# Patient Record
Sex: Female | Born: 1965 | Race: Black or African American | Hispanic: No | Marital: Married | Smoking: Never smoker
Health system: Southern US, Community
[De-identification: ages and names within clinical notes are randomized; demographics above are authoritative.]

## PROBLEM LIST (undated history)

## (undated) DIAGNOSIS — E785 Hyperlipidemia, unspecified: Secondary | ICD-10-CM

## (undated) DIAGNOSIS — E079 Disorder of thyroid, unspecified: Secondary | ICD-10-CM

## (undated) DIAGNOSIS — D649 Anemia, unspecified: Secondary | ICD-10-CM

## (undated) HISTORY — PX: TUBAL LIGATION: SHX77

## (undated) HISTORY — DX: Hyperlipidemia, unspecified: E78.5

## (undated) HISTORY — DX: Anemia, unspecified: D64.9

## (undated) HISTORY — DX: Disorder of thyroid, unspecified: E07.9

---

## 2005-08-17 ENCOUNTER — Ambulatory Visit: Payer: Self-pay | Admitting: Family Medicine

## 2005-11-01 ENCOUNTER — Other Ambulatory Visit: Admission: RE | Admit: 2005-11-01 | Discharge: 2005-11-01 | Payer: Self-pay | Admitting: Family Medicine

## 2005-11-01 ENCOUNTER — Ambulatory Visit: Payer: Self-pay | Admitting: Family Medicine

## 2005-11-01 LAB — CONVERTED CEMR LAB: Pap Smear: NORMAL

## 2005-11-02 ENCOUNTER — Encounter: Payer: Self-pay | Admitting: Family Medicine

## 2007-05-30 ENCOUNTER — Encounter: Payer: Self-pay | Admitting: Family Medicine

## 2007-05-30 DIAGNOSIS — E039 Hypothyroidism, unspecified: Secondary | ICD-10-CM | POA: Insufficient documentation

## 2007-05-30 DIAGNOSIS — E049 Nontoxic goiter, unspecified: Secondary | ICD-10-CM | POA: Insufficient documentation

## 2007-05-30 DIAGNOSIS — E785 Hyperlipidemia, unspecified: Secondary | ICD-10-CM | POA: Insufficient documentation

## 2007-05-30 DIAGNOSIS — E063 Autoimmune thyroiditis: Secondary | ICD-10-CM | POA: Insufficient documentation

## 2007-05-31 ENCOUNTER — Ambulatory Visit: Payer: Self-pay | Admitting: Family Medicine

## 2007-05-31 LAB — CONVERTED CEMR LAB: Beta hcg, urine, semiquantitative: POSITIVE

## 2007-06-01 ENCOUNTER — Encounter: Payer: Self-pay | Admitting: Family Medicine

## 2007-06-02 ENCOUNTER — Ambulatory Visit: Payer: Self-pay | Admitting: Obstetrics & Gynecology

## 2007-06-07 ENCOUNTER — Ambulatory Visit (HOSPITAL_COMMUNITY): Admission: RE | Admit: 2007-06-07 | Discharge: 2007-06-07 | Payer: Self-pay | Admitting: Gynecology

## 2007-06-15 ENCOUNTER — Encounter: Payer: Self-pay | Admitting: Obstetrics & Gynecology

## 2007-06-15 ENCOUNTER — Ambulatory Visit: Payer: Self-pay | Admitting: Obstetrics & Gynecology

## 2007-06-16 ENCOUNTER — Ambulatory Visit: Payer: Self-pay | Admitting: Obstetrics & Gynecology

## 2007-07-03 ENCOUNTER — Encounter: Payer: Self-pay | Admitting: Family Medicine

## 2007-07-13 ENCOUNTER — Ambulatory Visit: Payer: Self-pay | Admitting: Obstetrics & Gynecology

## 2007-07-21 ENCOUNTER — Ambulatory Visit (HOSPITAL_COMMUNITY): Admission: RE | Admit: 2007-07-21 | Discharge: 2007-07-21 | Payer: Self-pay | Admitting: Obstetrics & Gynecology

## 2007-08-01 ENCOUNTER — Ambulatory Visit (HOSPITAL_COMMUNITY): Admission: RE | Admit: 2007-08-01 | Discharge: 2007-08-01 | Payer: Self-pay | Admitting: Obstetrics & Gynecology

## 2007-08-14 ENCOUNTER — Ambulatory Visit: Payer: Self-pay | Admitting: Family Medicine

## 2007-08-23 ENCOUNTER — Ambulatory Visit (HOSPITAL_COMMUNITY): Admission: RE | Admit: 2007-08-23 | Discharge: 2007-08-23 | Payer: Self-pay | Admitting: Obstetrics & Gynecology

## 2007-09-13 ENCOUNTER — Ambulatory Visit: Payer: Self-pay | Admitting: Obstetrics & Gynecology

## 2007-10-04 ENCOUNTER — Ambulatory Visit: Payer: Self-pay | Admitting: Obstetrics & Gynecology

## 2007-10-25 ENCOUNTER — Ambulatory Visit: Payer: Self-pay | Admitting: Obstetrics & Gynecology

## 2007-11-09 ENCOUNTER — Ambulatory Visit: Payer: Self-pay | Admitting: Obstetrics & Gynecology

## 2007-11-27 ENCOUNTER — Encounter: Payer: Self-pay | Admitting: Family Medicine

## 2007-12-06 ENCOUNTER — Ambulatory Visit: Payer: Self-pay | Admitting: Family Medicine

## 2007-12-08 ENCOUNTER — Ambulatory Visit (HOSPITAL_COMMUNITY): Admission: RE | Admit: 2007-12-08 | Discharge: 2007-12-08 | Payer: Self-pay | Admitting: Gynecology

## 2007-12-10 ENCOUNTER — Inpatient Hospital Stay (HOSPITAL_COMMUNITY): Admission: AD | Admit: 2007-12-10 | Discharge: 2007-12-12 | Payer: Self-pay | Admitting: Obstetrics and Gynecology

## 2007-12-10 ENCOUNTER — Ambulatory Visit: Payer: Self-pay | Admitting: Family Medicine

## 2008-01-23 ENCOUNTER — Ambulatory Visit: Payer: Self-pay | Admitting: Family Medicine

## 2008-01-25 ENCOUNTER — Ambulatory Visit: Payer: Self-pay | Admitting: Vascular Surgery

## 2008-01-25 ENCOUNTER — Encounter: Payer: Self-pay | Admitting: Family Medicine

## 2008-01-25 ENCOUNTER — Ambulatory Visit (HOSPITAL_COMMUNITY): Admission: RE | Admit: 2008-01-25 | Discharge: 2008-01-25 | Payer: Self-pay | Admitting: Family Medicine

## 2008-02-27 ENCOUNTER — Encounter: Payer: Self-pay | Admitting: Family Medicine

## 2008-02-27 ENCOUNTER — Ambulatory Visit: Payer: Self-pay | Admitting: Family Medicine

## 2008-03-01 ENCOUNTER — Encounter: Payer: Self-pay | Admitting: Family Medicine

## 2008-10-01 ENCOUNTER — Ambulatory Visit: Payer: Self-pay | Admitting: Family Medicine

## 2008-10-01 DIAGNOSIS — H05019 Cellulitis of unspecified orbit: Secondary | ICD-10-CM | POA: Insufficient documentation

## 2009-02-20 ENCOUNTER — Ambulatory Visit: Payer: Self-pay | Admitting: Obstetrics & Gynecology

## 2009-02-20 ENCOUNTER — Encounter: Payer: Self-pay | Admitting: Obstetrics & Gynecology

## 2009-02-20 ENCOUNTER — Encounter: Payer: Self-pay | Admitting: Family Medicine

## 2009-02-20 LAB — CONVERTED CEMR LAB
Cholesterol: 217 mg/dL — ABNORMAL HIGH (ref 0–200)
Glucose, Bld: 85 mg/dL (ref 70–99)
HDL: 46 mg/dL (ref >39)
LDL Cholesterol: 149 mg/dL — ABNORMAL HIGH (ref 0–99)
Total CHOL/HDL Ratio: 4.7

## 2010-03-20 ENCOUNTER — Encounter: Payer: Self-pay | Admitting: Family Medicine

## 2010-11-17 NOTE — Letter (Signed)
Summary: [facility]   Imported By: Lanelle Bal 03/26/2010 10:40:20  _____________________________________________________________________  External Attachment:    Type:   Image     Comment:   External Document

## 2011-03-02 NOTE — Op Note (Signed)
NAME:  Carrie May, Carrie May             ACCOUNT NO.:  [id number]   MEDICAL RECORD NO.:  [id number]          PATIENT TYPE:  INP   LOCATION:  9146                          FACILITY:  [facility]   PHYSICIAN:  Tanya S. Shawnie Pons, M.D.   DATE OF BIRTH:  [birth year]   DATE OF PROCEDURE:  12/11/2007  DATE OF DISCHARGE:                               OPERATIVE REPORT   PREOPERATIVE DIAGNOSIS:  1. Postpartum day #1 from spontaneous vaginal delivery.  2. Desires sterilization.   POSTOPERATIVE DIAGNOSIS:  1. Postpartum day #1 from spontaneous vaginal delivery.  2. Desires sterilization.   PROCEDURE:  Bilateral tubal occlusion with Filshie clips.   SURGEON:  Shelbie Proctor. Shawnie Pons, M.D.   ASSISTANT:  Karlton Lemon, M.D.   ANESTHESIA:  General.   FINDINGS:  Normal female anatomy with normal appearing tubes.   ESTIMATED BLOOD LOSS:  Minimal.   DRAINS:  Foley with clear yellow urine.   COMPLICATIONS:  None immediate.   SPECIMENS:  None.   INDICATIONS FOR PROCEDURE:  The patient is a 45 year old gravida 3 para  3-0-0-3 patient that delivered on December 10, 2007, via spontaneous  vaginal delivery.  It is her desire at this time to undergo permanent  sterilization with tubal ligation.  The patient was informed of the  permanent nature of this procedure and wishes to proceed.  Risks,  benefits, and alternatives were discussed with the patient prior to the  procedure.   DESCRIPTION OF PROCEDURE:  The patient was taken to the operating room  and after obtaining adequate general anesthesia was prepped and draped  in the usual sterile fashion in supine position.  After ensuring  adequate anesthesia a small transverse skin incision was made below the  umbilicus.  The incision was carried down through the subcutaneous  tissues using the scalpel and Mayo scissors.  The fascia was identified  and separated and incised with a scalpel and Mayo scissors.  Army-Navy  retractors were then placed within the peritoneal  cavity.  The patient  placed in Trendelenburg with a rightward tilt.  The left fallopian tube  was identified and clamped with a Babcock clamp.  A second Babcock clamp  was used to follow the tube out and the fimbria was well visualized.  A  Filshie clip was then placed on the tube between the two Babcock clamps  under direct visualization.  After placement of the clip, the Babcock  clamps were released and the tube was allowed to fall back within the  peritoneal cavity.  Attention was then turned to the right tube.  The  patient was placed in a continued Trendelenburg position with a leftward  tilt.  The right tube was identified and clamped with a Babcock clamp.  A second Babcock clamp was placed and the tube was followed out to the  fimbria which was easily identified.  A Filshie clip was then placed  between the two Babcock clamps under good visualization.  The clamps  were then released and the tube was allowed to fall back within the  peritoneal cavity.  The fascia was then closed with 0 Vicryl in a  running locked fashion.  The skin was then closed with a subcuticular  stitch of 4-0 Vicryl.  0.25% Marcaine was injected in the subcutaneous  tissues after skin closure.  Estimated blood loss was minimal.  Needle,  sponge, and instrument counts correct x2.  The patient tolerated the  procedure well and went to the postanesthesia care unit in stable  condition.      Karlton Lemon, MD  Electronically Signed     ______________________________  Shelbie Proctor. Shawnie Pons, M.D.    NS/MEDQ  D:  12/11/2007  T:  12/12/2007  Job:  [id number]

## 2011-03-02 NOTE — Assessment & Plan Note (Signed)
NAME:  Carrie May, Carrie May             ACCOUNT NO.:  [id number]   MEDICAL RECORD NO.:  [id number]          PATIENT TYPE:  POB   LOCATION:  [facility] at [facility]         FACILITY:  [facility]   PHYSICIAN:  Tinnie Gens, MD        DATE OF BIRTH:  1966-07-27   DATE OF SERVICE:  01/23/2008                                  CLINIC NOTE   She is here for a postpartum check.   HISTORY OF PRESENT ILLNESS:  The patient is a 45 year old gravida 3,  para 3 who is 6 weeks postpartum from a vaginal delivery.  She is status  post tubal ligation in the hospital. She complains of calf pain that  started approximately 2 weeks postpartum. Traveled up the back side of  her leg and has been fairly persistent since then. She has a positive.  She denies any significant swelling of that leg.  She denies shortness  of breath.  She is nursing followed by a bottle feeding. The child is  not sleeping well.  Her mood is fine and she denies significant vaginal  bleeding.   PHYSICAL EXAMINATION:  Vitals are as noted in the chart.  She is a well-  developed, well-nourished female in no acute distress.  BTL incision is  well-healed. Calves are roughly the same size on measurement at 40 cm.  She does have a positive Homan's and a possible cord in the right lower  extremity.   IMPRESSION:  Pain in the calf with positive Homan's.   PLAN:  Followup for Pap smear in August of this year and lower extremity  Doppler to rule out deep vein thrombosis.           ______________________________  Tinnie Gens, MD     TP/MEDQ  D:  01/23/2008  T:  01/23/2008  Job:  [id number]

## 2011-03-02 NOTE — Assessment & Plan Note (Signed)
NAME:  EMERY, BINZ             ACCOUNT NO.:  [id number]   MEDICAL RECORD NO.:  [id number]          PATIENT TYPE:  POB   LOCATION:  [facility] at [facility]         FACILITY:  [facility]   PHYSICIAN:  Allie Bossier, MD        DATE OF BIRTH:  March 30, 1966   DATE OF SERVICE:  02/20/2009                                  CLINIC NOTE   Jashanti is a 45 year old married lady with 3 children ages two  daughters, they are 84 and 57 years old and her son who is 68.  She comes  in today for annual exam.  She has no particular GYN complaints.  She  does say that she finds it difficult to lose weight especially since her  thyroid dysfunction began.  She reports her periods are monthly and last  about 5 days.   PAST MEDICAL HISTORY:  Cholesterol was elevated last year.  She is  overweight.  She has hypothyroidism.   PAST SURGICAL HISTORY:  Tubal ligation.   FAMILY HISTORY:  Negative for breast, GYN, and colon malignancies.   SOCIAL HISTORY:  She reports rare alcohol use and denies tobacco or drug  use.   ALLERGIES:  No known drug allergies.  No to latex allergies.   MEDICATIONS:  She takes Flintstones twice a day, Armour thyroxine 90 mcg  a day.   PHYSICAL EXAMINATION:  VITAL SIGNS:  Height 5 feet 3 inches, weight 188,  blood pressure 112/91, pulse 83.  HEENT:  Normal breast exam normal bilaterally.  HEART:  Regular rate and rhythm.  LUNGS:  Clear to auscultation bilaterally.  ABDOMEN:  Benign.  No hepatosplenomegaly.  EXTERNAL GENITALIA:  No lesions.  Cervix parous, normal discharge.  Pap  smear was obtained.  Bimanual exam reveals a 8-week size uterus,  slightly deviated to the left.  There are no adnexal masses.   ASSESSMENT AND PLAN:  1. Annual exam checked a Pap smear.  Recommend self-breast and self-      vulvar exams.  2. Uterine enlargement, scheduled an ultrasound.  3. Overweight and history of high cholesterol.  I am rechecking      fasting lipids and sugar today, and I have suggested  a weight loss      plan.  I am rechecking her TSH today.  Should the TSH be in the      high range of normal      then I would suggest she increase her Armour thyroxine to increase      her metabolism and assist her in weight loss.       Allie Bossier, MD     MCD/MEDQ  D:  02/20/2009  T:  02/20/2009  Job:  [id number]

## 2011-03-02 NOTE — Assessment & Plan Note (Signed)
NAME:  Carrie May, Carrie May             ACCOUNT NO.:  [id number]   MEDICAL RECORD NO.:  [id number]          PATIENT TYPE:  POB   LOCATION:  [facility] at [facility]         FACILITY:  [facility]   PHYSICIAN:  Tinnie Gens, MD        DATE OF BIRTH:  07/25/1966   DATE OF SERVICE:  02/27/2008                                  CLINIC NOTE   CHIEF COMPLAINT:  Physical exam.   HISTORY OF PRESENT ILLNESS:  The patient is a 41-year gravida 3, para 3,  who is several months postpartum.  She is status post BTL.  Her last Pap  was in May 2008.  She is here today for her yearly exam.  She continues  to nurse.  Her baby is feeding well.  Her mood is well.   PAST MEDICAL HISTORY:  Significant for hypothyroidism.   PAST SURGICAL HISTORY:  BTL.   MEDICATIONS:  She is on the Armour Thyroid 90 mcg daily, on prenatal  vitamins daily.   ALLERGIES:  None known.   OBSTETRICAL HISTORY:  She has had 3 previous vaginal deliveries.   GYNECOLOGICAL HISTORY:  No history of abnormal Pap smears.   FAMILY HISTORY:  Diabetes and hypothyroidism.   SOCIAL HISTORY:  No tobacco, alcohol or drug use.   REVIEW OF SYSTEMS:  Fourteen-point review of systems reviewed and is  negative except as in the HPI.   PHYSICAL EXAMINATION:  GENERAL:  She is a well-developed, well-nourished  female in no acute distress.  NECK:  Supple.  Normal thyroid.  LUNGS:  Clear bilaterally.  CARDIOVASCULAR:  Regular rate and rhythm.  No rubs, gallops, murmurs.  ABDOMEN:  Soft, nontender, nondistended.  BREASTS:  Symmetric with everted nipples.  No masses.  No  supraclavicular or axillary adenopathy.  EXTREMITIES:  No cyanosis, clubbing or edema.  GU:  Normal external female genitalia.  BUS normal.  Vagina is pink and  rugated.  Cervix is parous without lesion.  The uterus is small,  anteverted.  No adnexal mass or tenderness.   IMPRESSION:  1. Gynecology examination with Pap smear.  2. Hypothyroidism.   PLAN:  1. Check fasting lipids, CMP,  CBC and TSH.  2. Pap smear today.  3. Follow up in a year.  4. Schedule mammogram when she stops breast-feeding.           ______________________________  Tinnie Gens, MD     TP/MEDQ  D:  02/27/2008  T:  02/27/2008  Job:  [id number]

## 2011-07-09 LAB — CBC
HCT: 21.4 — ABNORMAL LOW
Hemoglobin: 10 — ABNORMAL LOW
MCHC: 33.9
MCHC: 34.3
MCV: 82.6
Platelets: 187
Platelets: 216
Platelets: 231
RBC: 2.59 — ABNORMAL LOW
RBC: 3.58 — ABNORMAL LOW
RBC: 3.74 — ABNORMAL LOW
RDW: 13.6
RDW: 14
WBC: 10.3
WBC: 9.3

## 2011-07-09 LAB — CCBB MATERNAL DONOR DRAW

## 2011-11-09 ENCOUNTER — Ambulatory Visit (INDEPENDENT_AMBULATORY_CARE_PROVIDER_SITE_OTHER): Payer: BLUE CROSS/BLUE SHIELD | Admitting: Obstetrics and Gynecology

## 2011-11-09 ENCOUNTER — Encounter: Payer: Self-pay | Admitting: Obstetrics and Gynecology

## 2011-11-09 VITALS — BP 139/83 | HR 62 | Ht 63.0 in | Wt 166.0 lb

## 2011-11-09 DIAGNOSIS — N390 Urinary tract infection, site not specified: Secondary | ICD-10-CM

## 2011-11-09 DIAGNOSIS — N760 Acute vaginitis: Secondary | ICD-10-CM

## 2011-11-09 LAB — POCT URINALYSIS DIPSTICK
Ketones, UA: NEGATIVE
Leukocytes, UA: NEGATIVE

## 2011-11-09 NOTE — Progress Notes (Signed)
46 yo G3P3 presenting today for evaluation of burning vaginal disharge.  Patient is in a monogamous relationship and is not concerned about STD. Patient denies any odor with discharge. Patient has not been seen in this office since 2010 and reports receiving her annual exams and mammogram from previous MD office.  SSE: Small thin white discharde, no odor. Normal appearing vagina and cervix  A/P 46 yo G3p3 with vaginitis -wet prep collected - patient will be contacted with any abnormal results - Patient to return to our office for annual exam if not already performed in the last 12 months if she desires.

## 2011-11-10 ENCOUNTER — Telehealth: Payer: Self-pay | Admitting: *Deleted

## 2011-11-10 DIAGNOSIS — N760 Acute vaginitis: Secondary | ICD-10-CM

## 2011-11-10 LAB — WET PREP, GENITAL: Yeast Wet Prep HPF POC: NONE SEEN

## 2011-11-10 MED ORDER — METRONIDAZOLE 500 MG PO TABS: 500.0000 mg | ORAL_TABLET | Freq: Two times a day (BID) | ORAL | Status: AC

## 2011-11-10 NOTE — Telephone Encounter (Signed)
Culture came back positive for clue cells.  Meds called in.

## 2011-11-30 ENCOUNTER — Ambulatory Visit (INDEPENDENT_AMBULATORY_CARE_PROVIDER_SITE_OTHER): Payer: BLUE CROSS/BLUE SHIELD | Admitting: Family Medicine

## 2011-11-30 ENCOUNTER — Encounter: Payer: Self-pay | Admitting: Family Medicine

## 2011-11-30 DIAGNOSIS — Z1272 Encounter for screening for malignant neoplasm of vagina: Secondary | ICD-10-CM

## 2011-11-30 DIAGNOSIS — N760 Acute vaginitis: Secondary | ICD-10-CM

## 2011-11-30 DIAGNOSIS — Z01419 Encounter for gynecological examination (general) (routine) without abnormal findings: Secondary | ICD-10-CM

## 2011-11-30 DIAGNOSIS — A499 Bacterial infection, unspecified: Secondary | ICD-10-CM

## 2011-11-30 LAB — TSH: TSH: 0.525 u[IU]/mL (ref 0.350–4.500)

## 2011-11-30 LAB — COMPREHENSIVE METABOLIC PANEL
Albumin: 4.8 g/dL (ref 3.5–5.2)
Alkaline Phosphatase: 64 U/L (ref 39–117)
Calcium: 9.6 mg/dL (ref 8.4–10.5)
Chloride: 103 mEq/L (ref 96–112)
Creat: 0.78 mg/dL (ref 0.50–1.10)
Glucose, Bld: 80 mg/dL (ref 70–99)
Potassium: 4.2 mEq/L (ref 3.5–5.3)
Sodium: 139 mEq/L (ref 135–145)

## 2011-11-30 LAB — LIPID PANEL
Cholesterol: 211 mg/dL — ABNORMAL HIGH (ref 0–200)
HDL: 50 mg/dL (ref >39)
LDL Cholesterol: 143 mg/dL — ABNORMAL HIGH (ref 0–99)
Total CHOL/HDL Ratio: 4.2 Ratio

## 2011-11-30 MED ORDER — METRONIDAZOLE 0.75 % VA GEL: 1.0000 | Freq: Every day | VAGINAL | Status: AC

## 2011-11-30 NOTE — Progress Notes (Signed)
  Subjective:     Carrie May is a 46 y.o. female and is here for a comprehensive physical exam. The patient reports no problems.  History   Social History  . Marital Status: Married    Spouse Name: N/A    Number of Children: N/A  . Years of Education: N/A   Occupational History  . Not on file.   Social History Main Topics  . Smoking status: Never Smoker   . Smokeless tobacco: Not on file  . Alcohol Use: Yes     occasion  . Drug Use: No  . Sexually Active: Yes -- Female partner(s)    Birth Control/ Protection: Condom, Surgical     tubalization.   Other Topics Concern  . Not on file   Social History Narrative  . No narrative on file   Health Maintenance  Topic Date Due  . Pap Smear  09/04/1984  . Tetanus/tdap  09/04/1985  . Influenza Vaccine  07/19/2011    The following portions of the patient's history were reviewed and updated as appropriate: allergies, current medications, past family history, past medical history, past social history, past surgical history and problem list.  Review of Systems A comprehensive review of systems was negative.   Objective:    BP 132/65  Pulse 63  Ht 5\' 3"  (1.6 m)  Wt 167 lb (75.751 kg)  BMI 29.58 kg/m2  LMP 11/22/2011 General appearance: alert, cooperative and appears stated age Head: Normocephalic, without obvious abnormality, atraumatic Neck: no adenopathy, supple, symmetrical, trachea midline and thyroid not enlarged, symmetric, no tenderness/mass/nodules Lungs: clear to auscultation bilaterally Breasts: normal appearance, no masses or tenderness Heart: regular rate and rhythm, S1, S2 normal, no murmur, click, rub or gallop Abdomen: soft, non-tender; bowel sounds normal; no masses,  no organomegaly Pelvic: cervix normal in appearance, external genitalia normal, no adnexal masses or tenderness, no cervical motion tenderness, uterus normal size, shape, and consistency and vagina normal without discharge Extremities:  extremities normal, atraumatic, no cyanosis or edema Pulses: 2+ and symmetric Skin: Skin color, texture, turgor normal. No rashes or lesions Lymph nodes: Cervical, supraclavicular, and axillary nodes normal. Neurologic: Grossly normal    Assessment:    Healthy female exam. BV     Plan:    Pap smear Mammo TSH, CBC, CMP,Lipid Metrogel See After Visit Summary for Counseling Recommendations

## 2011-11-30 NOTE — Patient Instructions (Signed)

## 2011-12-01 ENCOUNTER — Telehealth: Payer: Self-pay | Admitting: Gynecology

## 2011-12-01 LAB — CBC
HCT: 41.1 % (ref 36.0–46.0)
MCH: 25.8 pg — ABNORMAL LOW (ref 26.0–34.0)
MCHC: 30.2 g/dL (ref 30.0–36.0)
RBC: 4.8 MIL/uL (ref 3.87–5.11)

## 2011-12-01 NOTE — Telephone Encounter (Signed)
Spoke to patient regarding labs result. Patient aware of high cholesterol level and will diet and exercise per doctor order.

## 2011-12-02 NOTE — Progress Notes (Signed)
Patient was notified of all of her test results.

## 2011-12-14 ENCOUNTER — Encounter: Payer: Self-pay | Admitting: Family Medicine

## 2014-08-02 ENCOUNTER — Ambulatory Visit (INDEPENDENT_AMBULATORY_CARE_PROVIDER_SITE_OTHER): Payer: BC Managed Care – PPO | Admitting: Family Medicine

## 2014-08-02 ENCOUNTER — Encounter: Payer: Self-pay | Admitting: Family Medicine

## 2014-08-02 VITALS — BP 90/64 | HR 66 | Temp 98.6°F | Ht 63.0 in | Wt 187.5 lb

## 2014-08-02 DIAGNOSIS — H109 Unspecified conjunctivitis: Secondary | ICD-10-CM

## 2014-08-02 DIAGNOSIS — R05 Cough: Secondary | ICD-10-CM

## 2014-08-02 DIAGNOSIS — R059 Cough, unspecified: Secondary | ICD-10-CM

## 2014-08-02 MED ORDER — POLYMYXIN B-TRIMETHOPRIM 10000-0.1 UNIT/ML-% OP SOLN: 1.0000 [drp] | Freq: Four times a day (QID) | OPHTHALMIC | Status: AC

## 2014-08-02 NOTE — Progress Notes (Signed)
Pre visit review using our clinic review tool, if applicable. No additional management support is needed unless otherwise documented below in the visit note. 

## 2014-08-02 NOTE — Patient Instructions (Addendum)
Consider trial of zyrtec at bedtime. Antibiotic drops as directed.Call if not improving as expected.

## 2014-08-02 NOTE — Progress Notes (Signed)
   Subjective:    Patient ID: Carrie May, female    DOB: 02/17/1966, 48 y.o.   MRN: [id number]  Conjunctivitis  Associated symptoms include eye itching, cough, eye discharge and eye redness. Pertinent negatives include no fever, no abdominal pain, no ear pain, no sore throat and no eye pain.   48 year old female pt of Dr.  Royden Purlower's with history of hypothyroid presents with new onset redness, eye discharge bilateral x 3 weeks.  Had initially in right eye.  She thought symptoms went away, put back in contavt lens and symptoms came back . Threw away contact lens now.  Spread to left eye as well.  Heavyness, no pain, some itchyness in eyes. No fever.  Tried visine and pink eye drops OTC. With some relife temporarily.  nonsmoker No past history of allergies in the fall   Review of Systems  Constitutional: Negative for fever and fatigue.  HENT: Negative for ear pain, postnasal drip, sinus pressure and sore throat.   Eyes: Positive for discharge, redness and itching. Negative for pain.  Respiratory: Positive for cough. Negative for chest tightness and shortness of breath.        Occuring at night    Cardiovascular: Negative for chest pain, palpitations and leg swelling.  Gastrointestinal: Negative for abdominal pain.  Genitourinary: Negative for dysuria.       Objective:   Physical Exam  Constitutional: Vital signs are normal. She appears well-developed and well-nourished. She is cooperative.  Non-toxic appearance. She does not appear ill. No distress.  HENT:  Head: Normocephalic.  Right Ear: Hearing, tympanic membrane, external ear and ear canal normal. Tympanic membrane is not erythematous, not retracted and not bulging.  Left Ear: Hearing, tympanic membrane, external ear and ear canal normal. Tympanic membrane is not erythematous, not retracted and not bulging.  Nose: Mucosal edema and rhinorrhea present. Right sinus exhibits no maxillary sinus tenderness and no frontal sinus  tenderness. Left sinus exhibits no maxillary sinus tenderness and no frontal sinus tenderness.  Mouth/Throat: Uvula is midline, oropharynx is clear and moist and mucous membranes are normal.  Pale turbinates  Eyes: EOM are normal. Pupils are equal, round, and reactive to light. Lids are everted and swept, no foreign bodies found. Right eye exhibits no discharge and no exudate. Left eye exhibits no discharge and no exudate. Right conjunctiva is not injected. Right conjunctiva has no hemorrhage. Left conjunctiva is injected. Left conjunctiva has no hemorrhage.  Neck: Trachea normal and normal range of motion. Neck supple. Carotid bruit is not present. No mass and no thyromegaly present.  Cardiovascular: Normal rate, regular rhythm, S1 normal, S2 normal, normal heart sounds, intact distal pulses and normal pulses.  Exam reveals no gallop and no friction rub.   No murmur heard. Pulmonary/Chest: Effort normal and breath sounds normal. Not tachypneic. No respiratory distress. She has no decreased breath sounds. She has no wheezes. She has no rhonchi. She has no rales.  Neurological: She is alert.  Skin: Skin is warm, dry and intact. No rash noted.  Psychiatric: Her speech is normal and behavior is normal. Judgment normal. Her mood appears not anxious. Cognition and memory are normal. She does not exhibit a depressed mood.          Assessment & Plan:

## 2014-08-02 NOTE — Assessment & Plan Note (Signed)
Mot likely viral or allergic. Given contact lens wearer and recurrence, will treat with antibiotics drops.  Also treat allergic possibility with zyrtrec.

## 2014-08-02 NOTE — Assessment & Plan Note (Signed)
?   Uri vs allergies.  Consider trial of zyrtec at bedtime.

## 2014-08-19 ENCOUNTER — Encounter: Payer: Self-pay | Admitting: Family Medicine

## 2015-03-14 ENCOUNTER — Ambulatory Visit: Payer: Self-pay | Admitting: Obstetrics & Gynecology

## 2015-04-04 ENCOUNTER — Ambulatory Visit (INDEPENDENT_AMBULATORY_CARE_PROVIDER_SITE_OTHER): Payer: 59 | Admitting: Obstetrics and Gynecology

## 2015-04-04 ENCOUNTER — Encounter: Payer: Self-pay | Admitting: Obstetrics and Gynecology

## 2015-04-04 VITALS — BP 114/74 | HR 87 | Wt 178.0 lb

## 2015-04-04 DIAGNOSIS — Z124 Encounter for screening for malignant neoplasm of cervix: Secondary | ICD-10-CM

## 2015-04-04 DIAGNOSIS — Z1151 Encounter for screening for human papillomavirus (HPV): Secondary | ICD-10-CM

## 2015-04-04 DIAGNOSIS — Z01419 Encounter for gynecological examination (general) (routine) without abnormal findings: Secondary | ICD-10-CM

## 2015-04-04 DIAGNOSIS — N898 Other specified noninflammatory disorders of vagina: Secondary | ICD-10-CM | POA: Diagnosis not present

## 2015-04-04 NOTE — Progress Notes (Signed)
  Subjective:     Carrie May is a 49 y.o. female G3P3 with LMP 03/19/2015 who is here for a comprehensive physical exam. The patient reports no problems. She reports monthly cycles lasting 4-5 days. She is sexually active without issues. She denies urinary incontinence. She reports the presence of a non-pruritic discharge without odor which she treated for yeast infection.   History   Social History  . Marital Status: Married    Spouse Name: N/A  . Number of Children: N/A  . Years of Education: N/A   Occupational History  . Not on file.   Social History Main Topics  . Smoking status: Never Smoker   . Smokeless tobacco: Never Used  . Alcohol Use: Yes     Comment: occasion  . Drug Use: No  . Sexual Activity:    Partners: Male    Birth Control/ Protection: Condom, Surgical     Comment: tubalization.   Other Topics Concern  . Not on file   Social History Narrative   Health Maintenance  Topic Date Due  . HIV Screening  09/04/1981  . TETANUS/TDAP  09/04/1985  . PAP SMEAR  11/29/2014  . INFLUENZA VACCINE  05/19/2015   Past Medical History  Diagnosis Date  . Thyroid disease   . Hyperlipidemia    Past Surgical History  Procedure Laterality Date  . Tubal ligation     Family History  Problem Relation Age of Onset  . Hypothyroidism Mother   . Diabetes Maternal Grandmother   . Hypothyroidism Maternal Grandmother   . Cancer Maternal Grandfather     throat cancer       Review of Systems Pertinent items are noted in HPI.   Objective:      GENERAL: Well-developed, well-nourished female in no acute distress.  HEENT: Normocephalic, atraumatic. Sclerae anicteric.  NECK: Supple. Normal thyroid.  LUNGS: Clear to auscultation bilaterally.  HEART: Regular rate and rhythm. BREASTS: Symmetric in size. No palpable masses or lymphadenopathy, skin changes, or nipple drainage. ABDOMEN: Soft, nontender, nondistended. No organomegaly. PELVIC: Normal external female genitalia.  Vagina is pink and rugated.  Normal discharge. Normal appearing cervix. Uterus is normal in size. No adnexal mass or tenderness. EXTREMITIES: No cyanosis, clubbing, or edema, 2+ distal pulses.    Assessment:    Healthy female exam.      Plan:    pap smear collected and wet prep collected Referral for screening mammogram provided Advised to perform monthly breast and vulva exam Patient will be contacted with any abnormal results RTC in 1 year See After Visit Summary for Counseling Recommendations

## 2015-04-05 LAB — LIPID PANEL
CHOL/HDL RATIO: 3.9 ratio
Cholesterol: 176 mg/dL (ref 0–200)
HDL: 45 mg/dL — ABNORMAL LOW (ref >=46)
LDL CALC: 120 mg/dL — AB (ref 0–99)
TRIGLYCERIDES: 57 mg/dL (ref <150)
VLDL: 11 mg/dL (ref 0–40)

## 2015-04-05 LAB — COMPREHENSIVE METABOLIC PANEL
ALBUMIN: 3.8 g/dL (ref 3.5–5.2)
ALK PHOS: 63 U/L (ref 39–117)
ALT: 25 U/L (ref 0–35)
AST: 27 U/L (ref 0–37)
BILIRUBIN TOTAL: 0.3 mg/dL (ref 0.2–1.2)
BUN: 11 mg/dL (ref 6–23)
CHLORIDE: 105 meq/L (ref 96–112)
CO2: 26 meq/L (ref 19–32)
Calcium: 8.6 mg/dL (ref 8.4–10.5)
Creat: 0.77 mg/dL (ref 0.50–1.10)
GLUCOSE: 82 mg/dL (ref 70–99)
POTASSIUM: 3.9 meq/L (ref 3.5–5.3)
SODIUM: 140 meq/L (ref 135–145)
TOTAL PROTEIN: 6.9 g/dL (ref 6.0–8.3)

## 2015-04-05 LAB — WET PREP, GENITAL
CLUE CELLS WET PREP: NONE SEEN
TRICH WET PREP: NONE SEEN
WBC, Wet Prep HPF POC: NONE SEEN
YEAST WET PREP: NONE SEEN

## 2015-04-05 LAB — CBC
HEMATOCRIT: 27.7 % — AB (ref 36.0–46.0)
Hemoglobin: 7.4 g/dL — ABNORMAL LOW (ref 12.0–15.0)
MCH: 18.9 pg — ABNORMAL LOW (ref 26.0–34.0)
MCHC: 26.7 g/dL — AB (ref 30.0–36.0)
MCV: 70.7 fL — AB (ref 78.0–100.0)
PLATELETS: 186 10*3/uL (ref 150–400)
RBC: 3.92 MIL/uL (ref 3.87–5.11)
RDW: 16.8 % — AB (ref 11.5–15.5)
WBC: 5.6 10*3/uL (ref 4.0–10.5)

## 2015-04-05 LAB — TSH: TSH: 2.698 u[IU]/mL (ref 0.350–4.500)

## 2015-04-08 LAB — CYTOLOGY - PAP

## 2015-08-25 ENCOUNTER — Encounter: Payer: Self-pay | Admitting: Family Medicine

## 2015-08-25 ENCOUNTER — Ambulatory Visit (INDEPENDENT_AMBULATORY_CARE_PROVIDER_SITE_OTHER): Payer: BLUE CROSS/BLUE SHIELD | Admitting: Family Medicine

## 2015-08-25 VITALS — BP 118/82 | HR 61 | Temp 98.9°F | Ht 63.0 in | Wt 173.5 lb

## 2015-08-25 DIAGNOSIS — E030 Congenital hypothyroidism with diffuse goiter: Secondary | ICD-10-CM

## 2015-08-25 DIAGNOSIS — E049 Nontoxic goiter, unspecified: Secondary | ICD-10-CM | POA: Diagnosis not present

## 2015-08-25 DIAGNOSIS — D509 Iron deficiency anemia, unspecified: Secondary | ICD-10-CM

## 2015-08-25 DIAGNOSIS — E063 Autoimmune thyroiditis: Secondary | ICD-10-CM | POA: Diagnosis not present

## 2015-08-25 LAB — CBC WITH DIFFERENTIAL/PLATELET
BASOS ABS: 0.1 10*3/uL (ref 0.0–0.1)
Basophils Relative: 1.3 % (ref 0.0–3.0)
EOS ABS: 0.1 10*3/uL (ref 0.0–0.7)
Eosinophils Relative: 2.1 % (ref 0.0–5.0)
HEMATOCRIT: 30 % — AB (ref 36.0–46.0)
Hemoglobin: 8.6 g/dL — ABNORMAL LOW (ref 12.0–15.0)
LYMPHS PCT: 22.1 % (ref 12.0–46.0)
Lymphs Abs: 1.5 10*3/uL (ref 0.7–4.0)
MCHC: 28.6 g/dL — ABNORMAL LOW (ref 30.0–36.0)
MCV: 68 fl — ABNORMAL LOW (ref 78.0–100.0)
MONOS PCT: 4.8 % (ref 3.0–12.0)
Monocytes Absolute: 0.3 10*3/uL (ref 0.1–1.0)
NEUTROS ABS: 4.7 10*3/uL (ref 1.4–7.7)
NEUTROS PCT: 69.7 % (ref 43.0–77.0)
PLATELETS: 159 10*3/uL (ref 150.0–400.0)
RBC: 4.41 Mil/uL (ref 3.87–5.11)
RDW: 20.2 % — ABNORMAL HIGH (ref 11.5–15.5)
WBC: 6.7 10*3/uL (ref 4.0–10.5)

## 2015-08-25 LAB — IBC PANEL
IRON: 13 ug/dL — AB (ref 42–145)
SATURATION RATIOS: 2.8 % — AB (ref 20.0–50.0)
TRANSFERRIN: 329 mg/dL (ref 212.0–360.0)

## 2015-08-25 LAB — FERRITIN: Ferritin: 2.7 ng/mL — ABNORMAL LOW (ref 10.0–291.0)

## 2015-08-25 MED ORDER — THYROID 120 MG PO TABS: 120.0000 mg | ORAL_TABLET | Freq: Every day | ORAL | Status: DC

## 2015-08-25 NOTE — Assessment & Plan Note (Signed)
Hypothyroidism  Pt has no clinical changes No change in energy level/ hair or skin/ edema and no tremor Lab Results  Component Value Date   TSH 2.698 04/04/2015   she felt poorly with levothyroxine and instead takes Armor thyroid- ? Last T3 test  Pt has Hashimoto's thyroiditis and a goiter-unsure when due for US Saw Dr Patrecia PaceMorayati in the past and needs a new endocrinologist -will refer

## 2015-08-25 NOTE — Assessment & Plan Note (Signed)
Ref to new endocrinologist

## 2015-08-25 NOTE — Patient Instructions (Signed)
Stop at check out for referral to endocrinology  Lab today for anemia Continue your iron

## 2015-08-25 NOTE — Progress Notes (Signed)
Subjective:    Patient ID: Carrie May, female    DOB: 09/17/1966, 49 y.o.   MRN: [id number]  HPI Here for f/u   Working a lot   Nothing new health wise  Nothing new   Saw gyn in June Lab Results  Component Value Date   WBC 5.6 04/04/2015   HGB 7.4* 04/04/2015   HCT 27.7* 04/04/2015   MCV 70.7* 04/04/2015   PLT 186 04/04/2015   told her to take iron for anemia Started iron supplements - over the counter - ? What dose -once daily with meal  Also takes E and C Unsure why she is so anemic Periods are the same - 5 days (heavy for 3)- about 6 pads per day with some clots - she is used to that  No hx of sickle cell trait   No family hx  She had low HB 7 y ago, but nl 3 y ago   Lab Results  Component Value Date   TSH 2.698 04/04/2015   she is doing pretty good  Was seeing Dr Patrecia PaceMorayati in the past - but could not return there due to billing issue  Still taking armour thyroid  Has a goiter - has had an ultrasound (told it was fine)- ? Supposed to get an US this year- had to cancel in March - due to husband loosing job and she could not pay for the bill  Feels good on it -works better than synthroid  Has not had thyroid med for 1 month   She needs a new thyroid doctor      Was on a restricted diet for a while for wt loss  Eat a lot of lettuce Lean protein- chicken and eggs  Not exercising    Wt is down 5 lb with bmi of 30   Does not get flu shots   Patient Active Problem List   Diagnosis Date Noted  . Anemia, iron deficiency 08/25/2015  . Conjunctivitis 08/02/2014  . Cough 08/02/2014  . GOITER 05/30/2007  . Hypothyroidism 05/30/2007  . 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 HYPERLIPIDEMIA 05/30/2007   Past Medical History  Diagnosis Date  . Thyroid disease   . Hyperlipidemia    Past Surgical History  Procedure Laterality Date  . Tubal ligation     Social History  Substance Use Topics  . Smoking status: Never Smoker   . Smokeless tobacco: Never  Used  . Alcohol Use: 0.0 oz/week    0 Standard drinks or equivalent per week     Comment: occasion   Family History  Problem Relation Age of Onset  . Hypothyroidism Mother   . Diabetes Maternal Grandmother   . Hypothyroidism Maternal Grandmother   . Cancer Maternal Grandfather     throat cancer   No Known Allergies No current outpatient prescriptions on file prior to visit.   No current facility-administered medications on file prior to visit.     Review of Systems Review of Systems  Constitutional: Negative for fever, appetite change, fatigue and unexpected weight change.  Eyes: Negative for pain and visual disturbance.  Respiratory: Negative for cough and shortness of breath.   Cardiovascular: Negative for cp or palpitations    Gastrointestinal: Negative for nausea, diarrhea and constipation.  Genitourinary: Negative for urgency and frequency.  Skin: Negative for pallor or rash   Neurological: Negative for weakness, light-headedness, numbness and headaches.  Hematological: Negative for adenopathy. Does not bruise/bleed easily.  Psychiatric/Behavioral: Negative for dysphoric mood. The patient is  not nervous/anxious.         Objective:   Physical Exam  Constitutional: She appears well-developed and well-nourished. No distress.  obese and well appearing   HENT:  Head: Normocephalic and atraumatic.  Mouth/Throat: Oropharynx is clear and moist.  Eyes: Conjunctivae and EOM are normal. Pupils are equal, round, and reactive to light.  Neck: Normal range of motion. Neck supple. No JVD present. Carotid bruit is not present. Thyromegaly present.  Stable goiter  No thyroid bruit  Cardiovascular: Normal rate, regular rhythm, normal heart sounds and intact distal pulses.  Exam reveals no gallop.   Pulmonary/Chest: Effort normal and breath sounds normal. No respiratory distress. She has no wheezes. She has no rales.  No crackles  Abdominal: Soft. Bowel sounds are normal. She  exhibits no distension, no abdominal bruit and no mass. There is no tenderness.  No suprapubic tenderness or fullness    Musculoskeletal: She exhibits no edema.  Lymphadenopathy:    She has no cervical adenopathy.  Neurological: She is alert. She has normal reflexes.  Skin: Skin is warm and dry. No rash noted.  Mild pallor and conj pallor  Psychiatric: She has a normal mood and affect.          Assessment & Plan:   Problem List Items Addressed This Visit      Endocrine   Goiter   Relevant Medications   thyroid (ARMOUR) 120 MG tablet   Other Relevant Orders   Ambulatory referral to Endocrinology   HASHIMOTO'S THYROIDITIS    Ref to new endocrinologist       Relevant Medications   thyroid (ARMOUR) 120 MG tablet   Other Relevant Orders   Ambulatory referral to Endocrinology   Hypothyroidism    Hypothyroidism  Pt has no clinical changes No change in energy level/ hair or skin/ edema and no tremor Lab Results  Component Value Date   TSH 2.698 04/04/2015   she felt poorly with levothyroxine and instead takes Armor thyroid- ? Last T3 test  Pt has Hashimoto's thyroiditis and a goiter-unsure when due for [place] Saw Dr Patrecia Pace in the past and needs a new endocrinologist -will refer       Relevant Medications   thyroid (ARMOUR) 120 MG tablet   Other Relevant Orders   Ambulatory referral to Endocrinology     Other   Anemia, iron deficiency - Primary    Hb of 7.4 at gyn in June This fluctuates with menses Lab today incl ferritin  Pt takes 1 otc iron pill daily  No hx of [place] trait or absorb problems that she knows of        Relevant Medications   IRON PO   Other Relevant Orders   CBC with Differential/Platelet (Completed)   Ferritin (Completed)   IBC Panel (Completed)

## 2015-08-25 NOTE — Progress Notes (Signed)
Pre visit review using our clinic review tool, if applicable. No additional management support is needed unless otherwise documented below in the visit note. 

## 2015-08-25 NOTE — Assessment & Plan Note (Signed)
Hb of 7.4 at gyn in June This fluctuates with menses Lab today incl ferritin  Pt takes 1 otc iron pill daily  No hx of [place] trait or absorb problems that she knows of

## 2015-08-26 ENCOUNTER — Telehealth: Payer: Self-pay | Admitting: *Deleted

## 2015-08-26 MED ORDER — POLYSACCHARIDE IRON COMPLEX 150 MG PO CAPS: 150.0000 mg | ORAL_CAPSULE | Freq: Two times a day (BID) | ORAL | Status: DC

## 2015-08-26 NOTE — Telephone Encounter (Signed)
Rx sent to pharmacy and pt notified of Dr. Royden Purlower's comments/instructions and lab appt scheduled

## 2015-08-26 NOTE — Telephone Encounter (Signed)
-----   Message from Judy PimpleMarne A Tower, MD sent at 08/25/2015  5:44 PM EST ----- Anemia is mildly improved - and iron stores are very low  Please call in Nu iron 150 mg bid #[phone] ref (or get it otc if ins does not cover) Take a stool softener if needed for constipation with the iron  Re check cbc and ferritin for iron def anemia in 6-8 weeks

## 2015-09-08 ENCOUNTER — Encounter: Payer: Self-pay | Admitting: Family Medicine

## 2015-09-08 ENCOUNTER — Ambulatory Visit (INDEPENDENT_AMBULATORY_CARE_PROVIDER_SITE_OTHER): Payer: BLUE CROSS/BLUE SHIELD | Admitting: Endocrinology

## 2015-09-08 ENCOUNTER — Ambulatory Visit (INDEPENDENT_AMBULATORY_CARE_PROVIDER_SITE_OTHER): Payer: BLUE CROSS/BLUE SHIELD | Admitting: Family Medicine

## 2015-09-08 ENCOUNTER — Encounter: Payer: Self-pay | Admitting: Endocrinology

## 2015-09-08 VITALS — BP 110/74 | HR 72 | Temp 98.5°F | Ht 63.0 in | Wt 174.0 lb

## 2015-09-08 VITALS — BP 112/76 | HR 62 | Temp 98.7°F | Ht 63.0 in | Wt 169.1 lb

## 2015-09-08 DIAGNOSIS — E038 Other specified hypothyroidism: Secondary | ICD-10-CM

## 2015-09-08 DIAGNOSIS — D509 Iron deficiency anemia, unspecified: Secondary | ICD-10-CM

## 2015-09-08 DIAGNOSIS — E063 Autoimmune thyroiditis: Secondary | ICD-10-CM | POA: Insufficient documentation

## 2015-09-08 NOTE — Patient Instructions (Addendum)
Continue the iron twice daily Eat a balance diet  Let your gyn doctor know if periods get heavier Schedule non fasting labs in about 2 months to re check

## 2015-09-08 NOTE — Patient Instructions (Signed)
Do not take iron in ams

## 2015-09-08 NOTE — Progress Notes (Signed)
Subjective:    Patient ID: Carrie May, female    DOB: 1966-01-30, 49 y.o.   MRN: [id number]  HPI Here for f/u of iron def   Taking iron nu iron bid   No problems or side effects  A little constipation  Can deal with that   Hb is up to 8.6 (improved form 7.4) - has been on nu iron for 2 week   Energy level is improved a bit  Still stays cold - a little better    Iron is low  Ferritin is 2.7   Periods have always been the same - last about 5 days Only heavy 3 days out of the 5  Has to change pad 2 times within an hour / does pass clots  Was anemic since she was a teenager - had to take iron   Does not run in family  No sickle cell trait   Is 49 No hot flashes or night sweats   Patient Active Problem List   Diagnosis Date Noted  . Anemia, iron deficiency 08/25/2015  . Conjunctivitis 08/02/2014  . Cough 08/02/2014  . Goiter 05/30/2007  . Hypothyroidism 05/30/2007  . 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 HYPERLIPIDEMIA 05/30/2007   Past Medical History  Diagnosis Date  . Thyroid disease   . Hyperlipidemia    Past Surgical History  Procedure Laterality Date  . Tubal ligation     Social History  Substance Use Topics  . Smoking status: Never Smoker   . Smokeless tobacco: Never Used  . Alcohol Use: 0.0 oz/week    0 Standard drinks or equivalent per week     Comment: occasion   Family History  Problem Relation Age of Onset  . Hypothyroidism Mother   . Diabetes Maternal Grandmother   . Hypothyroidism Maternal Grandmother   . Cancer Maternal Grandfather     throat cancer   No Known Allergies Current Outpatient Prescriptions on File Prior to Visit  Medication Sig Dispense Refill  . iron polysaccharides (NU-IRON) 150 MG capsule Take 1 capsule (150 mg total) by mouth 2 (two) times daily. 60 capsule 5  . OVER THE COUNTER MEDICATION TAKES MULTIPLE VITAMINS TO HELP THYROID    . thyroid (ARMOUR) 120 MG tablet Take 1 tablet (120 mg total) by mouth daily  before breakfast. 30 tablet 5   No current facility-administered medications on file prior to visit.     Review of Systems Review of Systems  Constitutional: Negative for fever, appetite change, and unexpected weight change. pos for improving fatigue  Eyes: Negative for pain and visual disturbance.  Respiratory: Negative for cough and shortness of breath.   Cardiovascular: Negative for cp or palpitations    Gastrointestinal: Negative for nausea, diarrhea and constipation.  Genitourinary: Negative for urgency and frequency.  Skin: Negative for rash, pos for mild pallor  Neurological: Negative for weakness, light-headedness, numbness and headaches.  Hematological: Negative for adenopathy. Does not bruise/bleed easily.  Psychiatric/Behavioral: Negative for dysphoric mood. The patient is not nervous/anxious.         Objective:   Physical Exam  Constitutional: She appears well-developed and well-nourished. No distress.  Well appearing   HENT:  Head: Normocephalic and atraumatic.  Mouth/Throat: Oropharynx is clear and moist.  Eyes: Conjunctivae and EOM are normal. Pupils are equal, round, and reactive to light.  Conj pallor noted   Neck: Normal range of motion. Neck supple. No JVD present. Carotid bruit is not present. No thyromegaly present.  Cardiovascular: Normal rate, regular rhythm,  normal heart sounds and intact distal pulses.  Exam reveals no gallop.   Pulmonary/Chest: Effort normal and breath sounds normal. No respiratory distress. She has no wheezes. She has no rales.  No crackles  Abdominal: Soft. Bowel sounds are normal. She exhibits no distension, no abdominal bruit and no mass. There is no tenderness.  No suprapubic tenderness or fullness    Musculoskeletal: She exhibits no edema.  Lymphadenopathy:    She has no cervical adenopathy.  Neurological: She is alert. She has normal reflexes.  Skin: Skin is warm and dry. No rash noted. There is pallor.  Very mild pallor of  palms -per pt is improved  Psychiatric: She has a normal mood and affect.          Assessment & Plan:   Problem List Items Addressed This Visit      Other   Anemia, iron deficiency - Primary    Likely from menses (which I believe is a lot heavier than the pt thinks it is) Would rather not go on OC due to age- should have menopause soon  On Nu iron -some imp of HB (up to 8.6 from 7.4)  Iron stores and levels are very low - but she has only been on med for 2 wk Plan to continue 150 mg bid and re check lab in 2 mo  Will alert if symptomatic or inc fatigue

## 2015-09-08 NOTE — Progress Notes (Signed)
Patient ID: Carrie May, female   DOB: February 12, 1966, 49 y.o.   MRN: [id number]            Reason for Appointment:  Hypothyroidism, new visit    History of Present Illness:   Hypothyroidism was first diagnosed in 2005  The patient was about 2 months postpartum at that time and she believes that her thyroid was checked on a routine basis However she had been starting to have more fatigue especially in her legs even during her pregnancy Also she was having some hair loss and tendency to weight gain Records are not available from the time of diagnosis  She was started on levothyroxine by an endocrinologist and followed for some time on various doses About 8-9 years ago when she moved here she was complaining about having continued fatigue and some hair loss with Synthroid and the local endocrinologist change her to Armour thyroid Records are not available from a previous history and labs; TPO antibody was increased at 844 She was followed with annual ultrasound exams also but the last 3 reports from 2012 until 2015 do not indicate any significant abnormality except irregular texture, minimal enlargement of the right lobe and no nodules  She thinks she has been on the same dose of Armour Thyroid for about 2-3 years Her thyroid levels were checked by her PCP in 6/16 Apparently because she could not get a refill of her Armour thyroid from her endocrinologist she went without this for about a month until early November 2016.  She started feeling tired again She has been taking her Armour thyroid regularly since then However she appears to be taking her iron tablets also at the same time in the last 3 weeks She does feel less tired than a month ago but still not back to normal         Patient's weight history is as follows:  Wt Readings from Last 3 Encounters:  09/08/15 169 lb 2 oz (76.715 kg)  09/08/15 174 lb (78.926 kg)  08/25/15 173 lb 8 oz (78.699 kg)    Thyroid function results  have been as follows:  Lab Results  Component Value Date   TSH 2.698 04/04/2015   TSH 0.525 11/30/2011   TSH 6.821* 02/20/2009     Past Medical History  Diagnosis Date  . Thyroid disease   . Hyperlipidemia   . Anemia     Intercurrent iron deficiency anemia    Past Surgical History  Procedure Laterality Date  . Tubal ligation      Family History  Problem Relation Age of Onset  . Hypothyroidism Mother   . Diabetes Maternal Grandmother   . Cancer Maternal Grandfather     throat cancer  . Hypothyroidism Sister   . Hypothyroidism Son     Hyperthyroidism    Social History:  reports that she has never smoked. She has never used smokeless tobacco. She reports that she drinks alcohol. She reports that she does not use illicit drugs.  Allergies: No Known Allergies    Medication List       This list is accurate as of: 09/08/15  4:02 PM.  Always use your most recent med list.               iron polysaccharides 150 MG capsule  Commonly known as:  NU-IRON  Take 1 capsule (150 mg total) by mouth 2 (two) times daily.     OVER THE COUNTER MEDICATION  TAKES MULTIPLE VITAMINS TO HELP THYROID  thyroid 120 MG tablet  Commonly known as:  ARMOUR  Take 1 tablet (120 mg total) by mouth daily before breakfast.        Review of Systems:  Review of Systems  Constitutional: Positive for malaise/fatigue. Negative for weight loss.  Respiratory: Negative for shortness of breath.   Cardiovascular: Negative for palpitations.  Gastrointestinal: Negative for constipation.  Musculoskeletal: Negative for myalgias.  Neurological: Negative for tingling.               Examination:    BP 112/76 mmHg  Pulse 62  Temp(Src) 98.7 F (37.1 C) (Oral)  Ht 5\' 3"  (1.6 m)  Wt 169 lb 2 oz (76.715 kg)  BMI 29.97 kg/m2  LMP 09/04/2015  GENERAL:  Average build.  Pleasant, cooperative and looks well  No pallor, clubbing, lymphadenopathy or edema.  Skin:  no rash or  pigmentation.  EYES:  No prominence of the eyes or swelling of the eyelids  ENT: Oral mucosa and tongue normal.  THYROID:  Not palpable.  HEART:  Normal  S1 and S2; no murmur or click.  CHEST:    Lungs: Vescicular breath sounds heard equally.  No crepitations/ wheeze.  ABDOMEN:  Exam not indicated  NEUROLOGICAL: Reflexes are slightly brisk bilaterally at biceps.  JOINTS:  Normal peripheral joints.   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

## 2015-09-08 NOTE — Assessment & Plan Note (Addendum)
Likely from menses (which I believe is a lot heavier than the pt thinks it is) Would rather not go on OC due to age- should have menopause soon  On Nu iron -some imp of HB (up to 8.6 from 7.4)  Iron stores and levels are very low - but she has only been on med for 2 wk Plan to continue 150 mg bid and re check lab in 2 mo  Will alert if symptomatic or inc fatigue

## 2015-09-08 NOTE — Progress Notes (Signed)
Pre visit review using our clinic review tool, if applicable. No additional management support is needed unless otherwise documented below in the visit note. 

## 2015-10-27 ENCOUNTER — Other Ambulatory Visit: Payer: BLUE CROSS/BLUE SHIELD

## 2015-10-28 ENCOUNTER — Other Ambulatory Visit (INDEPENDENT_AMBULATORY_CARE_PROVIDER_SITE_OTHER): Payer: BLUE CROSS/BLUE SHIELD

## 2015-10-28 ENCOUNTER — Other Ambulatory Visit: Payer: BLUE CROSS/BLUE SHIELD

## 2015-10-28 DIAGNOSIS — D509 Iron deficiency anemia, unspecified: Secondary | ICD-10-CM | POA: Diagnosis not present

## 2015-10-28 LAB — CBC WITH DIFFERENTIAL/PLATELET
BASOS PCT: 0.7 % (ref 0.0–3.0)
Basophils Absolute: 0 10*3/uL (ref 0.0–0.1)
Eosinophils Absolute: 0.1 10*3/uL (ref 0.0–0.7)
Eosinophils Relative: 2.1 % (ref 0.0–5.0)
HEMATOCRIT: 28.6 % — AB (ref 36.0–46.0)
Hemoglobin: 8.2 g/dL — ABNORMAL LOW (ref 12.0–15.0)
LYMPHS ABS: 1.5 10*3/uL (ref 0.7–4.0)
LYMPHS PCT: 26.1 % (ref 12.0–46.0)
MONOS PCT: 5.1 % (ref 3.0–12.0)
Monocytes Absolute: 0.3 10*3/uL (ref 0.1–1.0)
NEUTROS ABS: 3.7 10*3/uL (ref 1.4–7.7)
NEUTROS PCT: 66 % (ref 43.0–77.0)
PLATELETS: 227 10*3/uL (ref 150.0–400.0)
RBC: 4.16 Mil/uL (ref 3.87–5.11)
RDW: 17.6 % — AB (ref 11.5–15.5)
WBC: 5.6 10*3/uL (ref 4.0–10.5)

## 2015-10-28 LAB — FERRITIN: Ferritin: 2.8 ng/mL — ABNORMAL LOW (ref 10.0–291.0)

## 2015-11-06 ENCOUNTER — Telehealth: Payer: Self-pay | Admitting: Family Medicine

## 2015-11-06 DIAGNOSIS — D509 Iron deficiency anemia, unspecified: Secondary | ICD-10-CM

## 2015-11-06 NOTE — Telephone Encounter (Signed)
-----   Message from Shon Millet, [place] sent at 11/06/2015 11:29 AM EST ----- Pt notified of lab results and Dr. Royden Purl comments, pt has been taking her iron so she would like a referral to a hematologist for further eval and treatment, I advise pt one of our [facility] will call to schedule appt., please put referral in

## 2015-11-06 NOTE — Telephone Encounter (Signed)
Ref done  

## 2015-11-07 NOTE — Telephone Encounter (Signed)
Pt aware [facility] will call and schedule

## 2015-11-10 ENCOUNTER — Ambulatory Visit (INDEPENDENT_AMBULATORY_CARE_PROVIDER_SITE_OTHER): Payer: BLUE CROSS/BLUE SHIELD | Admitting: Family Medicine

## 2015-11-10 ENCOUNTER — Encounter: Payer: Self-pay | Admitting: Family Medicine

## 2015-11-10 ENCOUNTER — Telehealth: Payer: Self-pay | Admitting: Hematology

## 2015-11-10 VITALS — BP 108/62 | HR 84 | Temp 98.4°F | Ht 63.0 in | Wt 165.2 lb

## 2015-11-10 DIAGNOSIS — D509 Iron deficiency anemia, unspecified: Secondary | ICD-10-CM

## 2015-11-10 NOTE — Progress Notes (Signed)
Subjective:    Patient ID: Carrie May, female    DOB: June 18, 1966, 50 y.o.   MRN: [id number]  HPI Here for f/u of iron def anemia   She is taking Nu Iron 150 bid Initially this helped with inc of Hb from 7.4 to 8.6 Now is back to 8.2  Lab Results  Component Value Date   WBC 5.6 10/28/2015   HGB 8.2 Repeated and verified X2.* 10/28/2015   HCT 28.6* 10/28/2015   MCV 68.8 Repeated and verified X2.* 10/28/2015   PLT 227.0 10/28/2015   Lab Results  Component Value Date   FERRITIN 2.8* 10/28/2015   (ferritin is not improving either)  No hx of [place] trait or thal  She is feeling tired  Says she has bags under her eyes   Menses - about the same  Heavy for at least 2-3 of the days (uses biggest pads or tampons and changes frequently)- passes some clots  This comes and goes  Hx of one small fibroid in the past  They are regular  She [facility] to [facility]yn care  She is not open to hormonal tx of any kind   Has appt here for her pap in March  No blood in stool or urine   Patient Active Problem List   Diagnosis Date Noted  . 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 Anemia, iron deficiency 08/25/2015  . Conjunctivitis 08/02/2014  . Cough 08/02/2014  . Goiter 05/30/2007  . Hypothyroidism 05/30/2007  . 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 HYPERLIPIDEMIA 05/30/2007   Past Medical History  Diagnosis Date  . Thyroid disease   . Hyperlipidemia   . Anemia     Intercurrent iron deficiency anemia   Past Surgical History  Procedure Laterality Date  . Tubal ligation     Social History  Substance Use Topics  . Smoking status: Never Smoker   . Smokeless tobacco: Never Used  . Alcohol Use: 0.0 oz/week    0 Standard drinks or equivalent per week     Comment: occasion   Family History  Problem Relation Age of Onset  . Hypothyroidism Mother   . Diabetes Maternal Grandmother   . Cancer Maternal Grandfather     throat cancer  . Hypothyroidism Sister     . Hypothyroidism Son     Hyperthyroidism   No Known Allergies Current Outpatient Prescriptions on File Prior to Visit  Medication Sig Dispense Refill  . iron polysaccharides (NU-IRON) 150 MG capsule Take 1 capsule (150 mg total) by mouth 2 (two) times daily. 60 capsule 5  . OVER THE COUNTER MEDICATION TAKES MULTIPLE VITAMINS TO HELP THYROID    . thyroid (ARMOUR) 120 MG tablet Take 1 tablet (120 mg total) by mouth daily before breakfast. 30 tablet 5   No current facility-administered medications on file prior to visit.     Review of Systems Review of Systems  Constitutional: Negative for fever, appetite change, and unexpected weight change. pos for fatigue Eyes: Negative for pain and visual disturbance.  Respiratory: Negative for cough and shortness of breath.   Cardiovascular: Negative for cp or palpitations    Gastrointestinal: Negative for nausea, diarrhea and constipation.  Genitourinary: Negative for urgency and frequency. pos for heavy menses  Skin: Negative for pallor or rash   Neurological: Negative for weakness, light-headedness, numbness and headaches.  Hematological: Negative for adenopathy. Does not bruise/bleed easily.  Psychiatric/Behavioral: Negative for dysphoric mood. The patient is not nervous/anxious.  Objective:   Physical Exam  Constitutional: She appears well-developed and well-nourished. No distress.  Well appearing   HENT:  Head: Normocephalic and atraumatic.  Mouth/Throat: Oropharynx is clear and moist.  Eyes: EOM are normal. Pupils are equal, round, and reactive to light.  Mild conj pallor  Neck: Normal range of motion. Neck supple. No JVD present. Carotid bruit is not present. No thyromegaly present.  Cardiovascular: Normal rate, regular rhythm, normal heart sounds and intact distal pulses.  Exam reveals no gallop.   Pulmonary/Chest: Effort normal and breath sounds normal. No respiratory distress. She has no wheezes. She has no rales.  No  crackles  Abdominal: Soft. Bowel sounds are normal. She exhibits no distension, no abdominal bruit and no mass. There is no tenderness.  Musculoskeletal: She exhibits no edema.  Lymphadenopathy:    She has no cervical adenopathy.  Neurological: She is alert. She has normal reflexes.  Skin: Skin is warm and dry. No rash noted. There is pallor.  Psychiatric: She has a normal mood and affect.          Assessment & Plan:   Problem List Items Addressed This Visit      Other   Anemia, iron deficiency - Primary    Ongoing  Now not improved with iron  Ferritin low  She declines any hormonal tx to help menses  Ref to hematology- ? If candidate for IV iron

## 2015-11-10 NOTE — Progress Notes (Signed)
Pre visit review using our clinic review tool, if applicable. No additional management support is needed unless otherwise documented below in the visit note. 

## 2015-11-10 NOTE — Telephone Encounter (Signed)
Pt aware of np appt. On 11/17/15 @ 10:30 Referring Dr. Milinda Antis Dx-iron def anemia

## 2015-11-10 NOTE — Patient Instructions (Signed)
Continue iron  Stop at check out to work on your referral to hematology for anemia

## 2015-11-10 NOTE — Assessment & Plan Note (Signed)
Ongoing  Now not improved with iron  Ferritin low  She declines any hormonal tx to help menses  Ref to hematology- ? If candidate for IV iron

## 2015-11-17 ENCOUNTER — Telehealth: Payer: Self-pay | Admitting: Hematology

## 2015-11-17 ENCOUNTER — Telehealth: Payer: Self-pay | Admitting: *Deleted

## 2015-11-17 ENCOUNTER — Ambulatory Visit (HOSPITAL_BASED_OUTPATIENT_CLINIC_OR_DEPARTMENT_OTHER): Payer: BLUE CROSS/BLUE SHIELD | Admitting: Hematology

## 2015-11-17 VITALS — BP 119/68 | HR 67 | Temp 98.3°F | Resp 18 | Ht 63.0 in | Wt 163.4 lb

## 2015-11-17 DIAGNOSIS — E063 Autoimmune thyroiditis: Secondary | ICD-10-CM | POA: Diagnosis not present

## 2015-11-17 DIAGNOSIS — D509 Iron deficiency anemia, unspecified: Secondary | ICD-10-CM

## 2015-11-17 NOTE — Progress Notes (Signed)
Marland Kitchen    HEMATOLOGY/ONCOLOGY CONSULTATION NOTE  Date of Service: 11/17/2015  Patient Care Team: Judy Pimple, MD as PCP - General  CHIEF COMPLAINTS/PURPOSE OF CONSULTATION:  Iron deficiency anemia not responding to po iron replacement  HISTORY OF PRESENTING ILLNESS:   Carrie May is a wonderful 50 y.o. female who has been referred to [place] by Dr Milinda Antis for evaluation and management of severe iron deficiency anemia not responding to po iron replacement.  Patient has a h/o Hashimoto's thyroiditis and is now hypothyroid and on armour thyroid replacement. She notes that she used to be anemic as a teenager due to heavy periods and was on po iron and never required PRBC transfusions. He was noted to have fatigue due to severe iron deficiency with hgb of 7.4 and ferritin level of 2.7. Thought to be some heavy menses. She has declined OCP's. Patient was started on Nu-Iron oral iron replacement  po BID, She notes that she was compliant with this but in a few months rpt iron levels still showed a ferritin level of about 2.8. There was some concern regarding iron absorption and she was referred to [place] for considering of IV iron replacement.  Patient notes that her diet is fairly balanced but that she has had an usually severe craving for eating lettuce. She notes that she has to eat several heads of lettuce daily and her mother also notes that this is very unusual. No other obvious PICA symptoms. No other obvious blood loss. Has been on several replacements including B12, turmeric, biotin and Vit D.  She notes that she has voluntarily cut down po intake and has lost weight from 198lbs to 163.4 lbs over the last 2 yrs.  MEDICAL HISTORY:  Past Medical History  Diagnosis Date  . Thyroid disease   . Hyperlipidemia   . Anemia     Intercurrent iron deficiency anemia    SURGICAL HISTORY: Past Surgical History  Procedure Laterality Date  . Tubal ligation      SOCIAL HISTORY: Social History    Social History  . Marital Status: Married    Spouse Name: N/A  . Number of Children: N/A  . Years of Education: N/A   Occupational History  . Not on file.   Social History Main Topics  . Smoking status: Never Smoker   . Smokeless tobacco: Never Used  . Alcohol Use: 0.0 oz/week    0 Standard drinks or equivalent per week     Comment: occasion  . Drug Use: No  . Sexual Activity:    Partners: Male    Birth Control/ Protection: Condom, Surgical     Comment: tubalization.   Other Topics Concern  . Not on file   Social History Narrative    FAMILY HISTORY: Family History  Problem Relation Age of Onset  . Hypothyroidism Mother   . Diabetes Maternal Grandmother   . Cancer Maternal Grandfather     throat cancer  . Hypothyroidism Sister   . Hypothyroidism Son     Hyperthyroidism    ALLERGIES:  has No Known Allergies.  MEDICATIONS:  Current Outpatient Prescriptions  Medication Sig Dispense Refill  . iron polysaccharides (NU-IRON) 150 MG capsule Take 1 capsule (150 mg total) by mouth 2 (two) times daily. 60 capsule 5  . OVER THE COUNTER MEDICATION TAKES MULTIPLE VITAMINS TO HELP THYROID    . thyroid (ARMOUR) 120 MG tablet Take 1 tablet (120 mg total) by mouth daily before breakfast. 30 tablet 5   No current  facility-administered medications for this visit.    REVIEW OF SYSTEMS:    10 Point review of Systems was done is negative except as noted above.  PHYSICAL EXAMINATION: ECOG PERFORMANCE STATUS: 1 - Symptomatic but completely ambulatory  . Filed Vitals:   11/17/15 1111  BP: 119/68  Pulse: 67  Temp: 98.3 F (36.8 C)  Resp: 18   Filed Weights   11/17/15 1111  Weight: 163 lb 6.4 oz (74.118 kg)   .Body mass index is 28.95 kg/(m^2).  GENERAL:alert, in no acute distress and comfortable SKIN: skin color, texture, turgor are normal, no rashes or significant lesions EYES: normal, conjunctiva are pink and non-injected, sclera clear OROPHARYNX:no exudate, no  erythema and lips, buccal mucosa, and tongue normal  NECK: supple, no JVD, thyroid normal size, non-tender, without nodularity LYMPH:  no palpable lymphadenopathy in the cervical, axillary or inguinal LUNGS: clear to auscultation with normal respiratory effort HEART: regular rate & rhythm,  no murmurs and no lower extremity edema ABDOMEN: abdomen soft, non-tender, normoactive bowel sounds  Musculoskeletal: no cyanosis of digits and no clubbing  PSYCH: alert & oriented x 3 with fluent speech NEURO: no focal motor/sensory deficits  LABORATORY DATA:  I have reviewed the data as listed  . CBC Latest Ref Rng 10/28/2015 08/25/2015 04/04/2015  WBC 4.0 - 10.5 K/uL 5.6 6.7 5.6  Hemoglobin 12.0 - 15.0 g/dL 8.2 Repeated and verified X2.(L) 8.6 Repeated and verified X2.(L) 7.4(L)  Hematocrit 36.0 - 46.0 % 28.6(L) 30.0(L) 27.7(L)  Platelets 150.0 - 400.0 K/uL 227.0 159.0 186   . CBC    Component Value Date/Time   WBC 5.6 10/28/2015 1013   RBC 4.16 10/28/2015 1013   HGB 8.2 Repeated and verified X2.* 10/28/2015 1013   HCT 28.6* 10/28/2015 1013   PLT 227.0 10/28/2015 1013   MCV 68.8 Repeated and verified X2.* 10/28/2015 1013   MCH 18.9* 04/04/2015 1001   MCHC 28.7 Repeated and verified X2.* 10/28/2015 1013   RDW 17.6* 10/28/2015 1013   LYMPHSABS 1.5 10/28/2015 1013   MONOABS 0.3 10/28/2015 1013   EOSABS 0.1 10/28/2015 1013   BASOSABS 0.0 10/28/2015 1013      . CMP Latest Ref Rng 04/04/2015 11/30/2011 02/20/2009  Glucose 70 - 99 mg/dL 82 80 85  BUN 6 - 23 mg/dL 11 8 -  Creatinine 1.61 - 1.10 mg/dL 0.96 0.45 -  Sodium 409 - 145 mEq/L 140 139 -  Potassium 3.5 - 5.3 mEq/L 3.9 4.2 -  Chloride 96 - 112 mEq/L 105 103 -  CO2 19 - 32 mEq/L 26 26 -  Calcium 8.4 - 10.5 mg/dL 8.6 9.6 -  Total Protein 6.0 - 8.3 g/dL 6.9 7.3 -  Total Bilirubin 0.2 - 1.2 mg/dL 0.3 0.5 -  Alkaline Phos 39 - 117 U/L 63 64 -  AST 0 - 37 U/L 27 18 -  ALT 0 - 35 U/L 25 13 -   . Lab Results  Component Value Date    IRON 13* 08/25/2015   IRONPCTSAT 2.8* 08/25/2015   (Iron and TIBC)  Lab Results  Component Value Date   FERRITIN 2.8* 10/28/2015      RADIOGRAPHIC STUDIES: I have personally reviewed the radiological images as listed and agreed with the findings in the report. No results found.  ASSESSMENT & PLAN:   50 yo female with   1) Microcytic Hypochromic Anemia due to severe iron deficiency 2) Iron deficiency likely due to heavy menstrual losses. ? Oral iron absorption issues. No improvement with  Nu Iron 150 mg po BID for 2-3 months. 3) hypothyroidism on replacement 4) Obesity - has voluntarily lost weight from 198lbs to 163.4 lbs over the last 2 years.  Plan -patient appears to have failure po iron replacement trial -after discussing the pros , cons and potential adverse effects the patient is agreeable to treating her severe iron deficiency aggressively with IV Feraheme -based on her labs she will need atleast IV feraheme  q weekly x 3 doses -RTC with Dr Candise Che in 6-8 weeks for reassess of iron status.  All of the patients questions were answered with apparent satisfaction. The patient knows to call the clinic with any problems, questions or concerns.  I spent 55 minutes counseling the patient face to face. The total time spent in the appointment was 60 minutes and more than 50% was on counseling and direct patient cares.    Wyvonnia Lora MD MS AAHIVMS [facility] Hematology/Oncology Physician [facility]  (Office):       [phone] (Work cell):  [phone] (Fax):           [phone]  11/17/2015 11:17 AM

## 2015-11-17 NOTE — Telephone Encounter (Signed)
Pt confirmed labs/ov per 01/30 POF, gave pt AVS and Calendar... KJ, sent msg to add IV Feraheme

## 2015-11-17 NOTE — Telephone Encounter (Signed)
Per staff message and POF I have scheduled appts. Advised scheduler of appts. JMW  

## 2015-11-24 ENCOUNTER — Ambulatory Visit (HOSPITAL_BASED_OUTPATIENT_CLINIC_OR_DEPARTMENT_OTHER): Payer: BLUE CROSS/BLUE SHIELD

## 2015-11-24 VITALS — BP 103/69 | HR 68 | Temp 98.3°F | Resp 18

## 2015-11-24 DIAGNOSIS — D509 Iron deficiency anemia, unspecified: Secondary | ICD-10-CM

## 2015-11-24 MED ORDER — SODIUM CHLORIDE 0.9 % IV SOLN
Freq: Once | INTRAVENOUS | Status: AC
  Administered 2015-11-24: 09:00:00 via INTRAVENOUS

## 2015-11-24 MED ORDER — SODIUM CHLORIDE 0.9 % IV SOLN
510.0000 mg | Freq: Once | INTRAVENOUS | Status: AC
  Administered 2015-11-24: 510 mg via INTRAVENOUS
  Filled 2015-11-24: qty 17

## 2015-11-24 NOTE — Progress Notes (Signed)
Pt tolerated feraheme infusion well. Pt monitored for 30 minutes post infusion. Pt and VS stable at time of discharge.

## 2015-11-24 NOTE — Patient Instructions (Signed)

## 2015-11-30 ENCOUNTER — Encounter: Payer: Self-pay | Admitting: Hematology

## 2015-12-01 ENCOUNTER — Ambulatory Visit (HOSPITAL_BASED_OUTPATIENT_CLINIC_OR_DEPARTMENT_OTHER): Payer: BLUE CROSS/BLUE SHIELD

## 2015-12-01 VITALS — BP 99/59 | HR 71 | Temp 98.7°F | Resp 18

## 2015-12-01 DIAGNOSIS — D509 Iron deficiency anemia, unspecified: Secondary | ICD-10-CM

## 2015-12-01 MED ORDER — FERUMOXYTOL INJECTION 510 MG/17 ML
510.0000 mg | Freq: Once | INTRAVENOUS | Status: AC
  Administered 2015-12-01: 510 mg via INTRAVENOUS
  Filled 2015-12-01: qty 17

## 2015-12-01 MED ORDER — SODIUM CHLORIDE 0.9 % IV SOLN
Freq: Once | INTRAVENOUS | Status: AC
  Administered 2015-12-01: 09:00:00 via INTRAVENOUS

## 2015-12-01 NOTE — Patient Instructions (Signed)

## 2015-12-04 ENCOUNTER — Other Ambulatory Visit (INDEPENDENT_AMBULATORY_CARE_PROVIDER_SITE_OTHER): Payer: BLUE CROSS/BLUE SHIELD

## 2015-12-04 DIAGNOSIS — E038 Other specified hypothyroidism: Secondary | ICD-10-CM

## 2015-12-04 DIAGNOSIS — E063 Autoimmune thyroiditis: Secondary | ICD-10-CM

## 2015-12-04 LAB — TSH: TSH: 0.47 u[IU]/mL (ref 0.35–4.50)

## 2015-12-08 ENCOUNTER — Ambulatory Visit (HOSPITAL_BASED_OUTPATIENT_CLINIC_OR_DEPARTMENT_OTHER): Payer: BLUE CROSS/BLUE SHIELD

## 2015-12-08 VITALS — BP 115/70 | HR 62 | Temp 98.3°F | Resp 18

## 2015-12-08 DIAGNOSIS — D509 Iron deficiency anemia, unspecified: Secondary | ICD-10-CM | POA: Diagnosis not present

## 2015-12-08 MED ORDER — SODIUM CHLORIDE 0.9 % IV SOLN
Freq: Once | INTRAVENOUS | Status: AC
  Administered 2015-12-08: 09:00:00 via INTRAVENOUS

## 2015-12-08 MED ORDER — SODIUM CHLORIDE 0.9 % IV SOLN
510.0000 mg | Freq: Once | INTRAVENOUS | Status: AC
  Administered 2015-12-08: 510 mg via INTRAVENOUS
  Filled 2015-12-08: qty 17

## 2015-12-08 NOTE — Patient Instructions (Signed)

## 2015-12-09 ENCOUNTER — Ambulatory Visit (INDEPENDENT_AMBULATORY_CARE_PROVIDER_SITE_OTHER): Payer: BLUE CROSS/BLUE SHIELD | Admitting: Endocrinology

## 2015-12-09 ENCOUNTER — Encounter: Payer: Self-pay | Admitting: Endocrinology

## 2015-12-09 VITALS — BP 118/78 | HR 65 | Temp 98.5°F | Resp 14 | Ht 63.0 in | Wt 168.4 lb

## 2015-12-09 DIAGNOSIS — E063 Autoimmune thyroiditis: Secondary | ICD-10-CM

## 2015-12-09 DIAGNOSIS — E038 Other specified hypothyroidism: Secondary | ICD-10-CM | POA: Diagnosis not present

## 2015-12-09 NOTE — Progress Notes (Signed)
Patient ID: Carrie May, female   DOB: May 18, 1966, 50 y.o.   MRN: [id number]            Reason for Appointment:  Hypothyroidism, follow-up visit    History of Present Illness:   Hypothyroidism was first diagnosed in 2005 She was about 2 months postpartum at that time and she believes that her thyroid was checked on a routine basis However she had been starting to have more fatigue especially in her legs even during her pregnancy Also she was having some hair loss and tendency to weight gain  She was started on levothyroxine by an endocrinologist and followed for some time on various doses About 8-9 years ago when she moved here she was complaining about having continued fatigue and some hair loss with Synthroid and the local endocrinologist change her to Armour thyroid Records are not available from a previous history and labs; TPO antibody was increased at 844 She was followed with annual ultrasound exams also but the last 3 reports from 2012 until 2015 do not indicate any significant abnormality except irregular texture, minimal enlargement of the right lobe and no nodules  She thinks she has been on the same dose of 120 mg Armour Thyroid for about 2-3 years  Recent history: Her TSH was normal on her initial consultation but she was told to take iron tablets later in the day instead of in the morning which she is doing She does not complain of any fatigue although has been dealing with severe anemia and iron deficiency in the last few weeks No heat or cold intolerance. Weight is about the same         Patient's weight history is as follows:  Wt Readings from Last 3 Encounters:  12/09/15 168 lb 6.4 oz (76.386 kg)  11/17/15 163 lb 6.4 oz (74.118 kg)  11/10/15 165 lb 4 oz (74.957 kg)    Thyroid function results have been as follows:  Lab Results  Component Value Date   TSH 0.47 12/04/2015   TSH 2.698 04/04/2015   TSH 0.525 11/30/2011     Past Medical History    Diagnosis Date  . Thyroid disease   . Hyperlipidemia   . Anemia     Intercurrent iron deficiency anemia    Past Surgical History  Procedure Laterality Date  . Tubal ligation      Family History  Problem Relation Age of Onset  . Hypothyroidism Mother   . Diabetes Maternal Grandmother   . Cancer Maternal Grandfather     throat cancer  . Hypothyroidism Sister   . Hypothyroidism Son     Hyperthyroidism    Social History:  reports that she has never smoked. She has never used smokeless tobacco. She reports that she drinks alcohol. She reports that she does not use illicit drugs.  Allergies: No Known Allergies    Medication List       This list is accurate as of: 12/09/15  5:06 PM.  Always use your most recent med list.               iron polysaccharides 150 MG capsule  Commonly known as:  NU-IRON  Take 1 capsule (150 mg total) by mouth 2 (two) times daily.     OVER THE COUNTER MEDICATION  TAKES MULTIPLE VITAMINS TO HELP THYROID     thyroid 120 MG tablet  Commonly known as:  ARMOUR  Take 1 tablet (120 mg total) by mouth daily before breakfast.  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

## 2015-12-09 NOTE — Patient Instructions (Signed)
Take only 1/2 pill on Sundays

## 2016-01-11 ENCOUNTER — Telehealth: Payer: Self-pay | Admitting: Family Medicine

## 2016-01-11 DIAGNOSIS — Z0000 Encounter for general adult medical examination without abnormal findings: Secondary | ICD-10-CM | POA: Insufficient documentation

## 2016-01-11 NOTE — Telephone Encounter (Signed)
-----   Message from Baldomero LamyNatasha C Chavers sent at 01/02/2016  9:32 AM EDT ----- Regarding: Cpx labs Mon 3/27, need orders. Thanks! :-) Please order  future cpx labs for pt's upcoming lab appt. Thanks Rodney Boozeasha

## 2016-01-12 ENCOUNTER — Other Ambulatory Visit (INDEPENDENT_AMBULATORY_CARE_PROVIDER_SITE_OTHER): Payer: BLUE CROSS/BLUE SHIELD

## 2016-01-12 DIAGNOSIS — Z0000 Encounter for general adult medical examination without abnormal findings: Secondary | ICD-10-CM | POA: Diagnosis not present

## 2016-01-12 LAB — TSH: TSH: 3.04 u[IU]/mL (ref 0.35–4.50)

## 2016-01-12 LAB — CBC WITH DIFFERENTIAL/PLATELET
BASOS PCT: 0.5 % (ref 0.0–3.0)
Basophils Absolute: 0 10*3/uL (ref 0.0–0.1)
EOS ABS: 0.1 10*3/uL (ref 0.0–0.7)
Eosinophils Relative: 1.2 % (ref 0.0–5.0)
HEMATOCRIT: 37.9 % (ref 36.0–46.0)
Hemoglobin: 12.1 g/dL (ref 12.0–15.0)
LYMPHS ABS: 1.3 10*3/uL (ref 0.7–4.0)
LYMPHS PCT: 19.9 % (ref 12.0–46.0)
MCHC: 31.9 g/dL (ref 30.0–36.0)
MCV: 79.9 fl (ref 78.0–100.0)
MONOS PCT: 4.7 % (ref 3.0–12.0)
Monocytes Absolute: 0.3 10*3/uL (ref 0.1–1.0)
NEUTROS ABS: 4.7 10*3/uL (ref 1.4–7.7)
Neutrophils Relative %: 73.7 % (ref 43.0–77.0)
PLATELETS: 211 10*3/uL (ref 150.0–400.0)
RBC: 4.75 Mil/uL (ref 3.87–5.11)
RDW: 25.9 % — AB (ref 11.5–15.5)
WBC: 6.4 10*3/uL (ref 4.0–10.5)

## 2016-01-12 LAB — LIPID PANEL
CHOL/HDL RATIO: 4
Cholesterol: 213 mg/dL — ABNORMAL HIGH (ref 0–200)
HDL: 50.1 mg/dL (ref >39.00)
LDL CALC: 144 mg/dL — AB (ref 0–99)
NonHDL: 163.33
TRIGLYCERIDES: 99 mg/dL (ref 0.0–149.0)
VLDL: 19.8 mg/dL (ref 0.0–40.0)

## 2016-01-12 LAB — COMPREHENSIVE METABOLIC PANEL
ALT: 13 U/L (ref 0–35)
AST: 14 U/L (ref 0–37)
Albumin: 4.2 g/dL (ref 3.5–5.2)
Alkaline Phosphatase: 62 U/L (ref 39–117)
BUN: 18 mg/dL (ref 6–23)
CALCIUM: 9.4 mg/dL (ref 8.4–10.5)
CHLORIDE: 107 meq/L (ref 96–112)
CO2: 29 meq/L (ref 19–32)
Creatinine, Ser: 0.91 mg/dL (ref 0.40–1.20)
GFR: 84.38 mL/min (ref >60.00)
Glucose, Bld: 95 mg/dL (ref 70–99)
POTASSIUM: 3.8 meq/L (ref 3.5–5.1)
Sodium: 141 mEq/L (ref 135–145)
Total Bilirubin: 0.2 mg/dL (ref 0.2–1.2)
Total Protein: 7.1 g/dL (ref 6.0–8.3)

## 2016-01-15 ENCOUNTER — Other Ambulatory Visit: Payer: BLUE CROSS/BLUE SHIELD

## 2016-01-15 ENCOUNTER — Ambulatory Visit: Payer: BLUE CROSS/BLUE SHIELD | Admitting: Hematology

## 2016-01-16 ENCOUNTER — Encounter: Payer: 59 | Admitting: Family Medicine

## 2016-02-02 ENCOUNTER — Encounter: Payer: BLUE CROSS/BLUE SHIELD | Admitting: Family Medicine

## 2016-02-18 ENCOUNTER — Ambulatory Visit (INDEPENDENT_AMBULATORY_CARE_PROVIDER_SITE_OTHER): Payer: BLUE CROSS/BLUE SHIELD | Admitting: Family Medicine

## 2016-02-18 ENCOUNTER — Encounter: Payer: Self-pay | Admitting: Family Medicine

## 2016-02-18 ENCOUNTER — Other Ambulatory Visit: Payer: Self-pay | Admitting: Family Medicine

## 2016-02-18 VITALS — BP 110/68 | HR 55 | Temp 98.4°F | Ht 62.25 in | Wt 173.5 lb

## 2016-02-18 DIAGNOSIS — E785 Hyperlipidemia, unspecified: Secondary | ICD-10-CM | POA: Diagnosis not present

## 2016-02-18 DIAGNOSIS — D509 Iron deficiency anemia, unspecified: Secondary | ICD-10-CM | POA: Diagnosis not present

## 2016-02-18 DIAGNOSIS — E030 Congenital hypothyroidism with diffuse goiter: Secondary | ICD-10-CM

## 2016-02-18 DIAGNOSIS — Z1231 Encounter for screening mammogram for malignant neoplasm of breast: Secondary | ICD-10-CM

## 2016-02-18 DIAGNOSIS — Z23 Encounter for immunization: Secondary | ICD-10-CM | POA: Diagnosis not present

## 2016-02-18 DIAGNOSIS — Z0000 Encounter for general adult medical examination without abnormal findings: Secondary | ICD-10-CM | POA: Diagnosis not present

## 2016-02-18 DIAGNOSIS — N924 Excessive bleeding in the premenopausal period: Secondary | ICD-10-CM

## 2016-02-18 MED ORDER — NORETHINDRONE ACET-ETHINYL EST 1-20 MG-MCG PO TABS: 1.0000 | ORAL_TABLET | Freq: Every day | ORAL | Status: DC

## 2016-02-18 NOTE — Patient Instructions (Signed)
Start the loestrin 1/20 -(generic) on Sunday- take every day at the same time- set an alarm on your cell phone to remind you  If any problems or side effects or if no improvement in periods in 2-3 months- let me know  Tdap vaccine today  Stop at check out for referral for mammogram  For cholesterol  (Avoid red meat/ fried foods/ egg yolks/ fatty breakfast meats/ butter, cheese and high fat dairy/ and shellfish)

## 2016-02-18 NOTE — Progress Notes (Signed)
Pre visit review using our clinic review tool, if applicable. No additional management support is needed unless otherwise documented below in the visit note. 

## 2016-02-18 NOTE — Progress Notes (Signed)
Subjective:    Patient ID: Carrie May, female    DOB: [birth year], 50 y.o.   MRN: [id number]  HPI Here for health maintenance exam and to review chronic medical problems    Feeling ok   Wt is up 5 lb with bmi of 31  HIV screen-not interested/not high risk   Td ? Last one -will do that today   Flu shot -did not get one this past season  She declines them   Pap 6/16 neg with gyn- Peggy Constant  No hx of abn paps  Having periods - and ever since her iron tx started menses are heavier with a lot of clotting  Periods last 5 days - heavy bleeding 2-4th days  No hx of fibroids  She is interested in trying OC for a while  Unsure of the last one she was on    Mm 2/13- required a re check- per pt her re check was normal  Wants to get one  Wants to go to [place] for the next one  Self breast exam-no lumps    Anemia/iron def Lab Results  Component Value Date   WBC 6.4 01/12/2016   HGB 12.1 01/12/2016   HCT 37.9 01/12/2016   MCV 79.9 01/12/2016   PLT 211.0 01/12/2016   getting IV iron now with hematologist Feels better This is up from hb in the 8 range  No more cravings  Energy level is better overall    Hypothyroidism with goiter Sees Dr Lucianne Muss- still taking armor thyroid  No change in goiter  No symptoms  Lab Results  Component Value Date   TSH 3.04 01/12/2016     Hyperlipidemia Lab Results  Component Value Date   CHOL 213* 01/12/2016   CHOL 176 04/04/2015   CHOL 211* 11/30/2011   Lab Results  Component Value Date   HDL 50.10 01/12/2016   HDL 45* 04/04/2015   HDL 50 11/30/2011   Lab Results  Component Value Date   LDLCALC 144* 01/12/2016   LDLCALC 120* 04/04/2015   LDLCALC 143* 11/30/2011   Lab Results  Component Value Date   TRIG 99.0 01/12/2016   TRIG 57 04/04/2015   TRIG 91 11/30/2011   Lab Results  Component Value Date   CHOLHDL 4 01/12/2016   CHOLHDL 3.9 04/04/2015   CHOLHDL 4.2 11/30/2011   No results found for: LDLDIRECT    This went up - but eating the same things  Maybe too many eggs  She does eat a fair amt of beef and sausage and bacon   Not a lot of fried food  No fast food  occ shellfish -not often  Some high fat dairy products   No regular exercise -not like she used to   BP Readings from Last 3 Encounters:  02/18/16 110/68  12/09/15 118/78  12/08/15 115/70      Chemistry      Component Value Date/Time   NA 141 01/12/2016 0917   K 3.8 01/12/2016 0917   CL 107 01/12/2016 0917   CO2 29 01/12/2016 0917   BUN 18 01/12/2016 0917   CREATININE 0.91 01/12/2016 0917   CREATININE 0.77 04/04/2015 1001      Component Value Date/Time   CALCIUM 9.4 01/12/2016 0917   ALKPHOS 62 01/12/2016 0917   AST 14 01/12/2016 0917   ALT 13 01/12/2016 0917   BILITOT 0.2 01/12/2016 0917      Patient Active Problem List   Diagnosis Date Noted  . Menorrhagia 02/19/2016  .  Encounter for screening mammogram for breast cancer 02/18/2016  . Routine general medical examination at a health care facility 01/11/2016  . 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 Anemia, iron deficiency 08/25/2015  . Conjunctivitis 08/02/2014  . Cough 08/02/2014  . Goiter 05/30/2007  . Hypothyroidism 05/30/2007  . 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 Hyperlipidemia 05/30/2007   Past Medical History  Diagnosis Date  . Thyroid disease   . Hyperlipidemia   . Anemia     Intercurrent iron deficiency anemia   Past Surgical History  Procedure Laterality Date  . Tubal ligation     Social History  Substance Use Topics  . Smoking status: Never Smoker   . Smokeless tobacco: Never Used  . Alcohol Use: 0.0 oz/week    0 Standard drinks or equivalent per week     Comment: occasion   Family History  Problem Relation Age of Onset  . Hypothyroidism Mother   . Diabetes Maternal Grandmother   . Cancer Maternal Grandfather     throat cancer  . Hypothyroidism Sister   . Hypothyroidism Son     Hyperthyroidism   No Known  Allergies Current Outpatient Prescriptions on File Prior to Visit  Medication Sig Dispense Refill  . OVER THE COUNTER MEDICATION TAKES MULTIPLE VITAMINS TO HELP THYROID    . thyroid (ARMOUR) 120 MG tablet Take 1 tablet (120 mg total) by mouth daily before breakfast. 30 tablet 5   No current facility-administered medications on file prior to visit.     Review of Systems Review of Systems  Constitutional: Negative for fever, appetite change, fatigue and unexpected weight change.  Eyes: Negative for pain and visual disturbance.  Respiratory: Negative for cough and shortness of breath.   Cardiovascular: Negative for cp or palpitations    Gastrointestinal: Negative for nausea, diarrhea and constipation.  Genitourinary: Negative for urgency and frequency. pos for heavier menses Skin: Negative for pallor or rash   Neurological: Negative for weakness, light-headedness, numbness and headaches.  Hematological: Negative for adenopathy. Does not bruise/bleed easily.  Psychiatric/Behavioral: Negative for dysphoric mood. The patient is not nervous/anxious.         Objective:   Physical Exam  Constitutional: She appears well-developed and well-nourished. No distress.  obese and well appearing   HENT:  Head: Normocephalic and atraumatic.  Right Ear: External ear normal.  Left Ear: External ear normal.  Mouth/Throat: Oropharynx is clear and moist.  Eyes: Conjunctivae and EOM are normal. Pupils are equal, round, and reactive to light. No scleral icterus.  Neck: Normal range of motion. Neck supple. No JVD present. Carotid bruit is not present.  Cardiovascular: Normal rate, regular rhythm, normal heart sounds and intact distal pulses.  Exam reveals no gallop.   Pulmonary/Chest: Effort normal and breath sounds normal. No respiratory distress. She has no wheezes. She exhibits no tenderness.  Abdominal: Soft. Bowel sounds are normal. She exhibits no distension, no abdominal bruit and no mass. There is  no tenderness.  No suprapubic tenderness or fullness    Genitourinary: No breast swelling, tenderness, discharge or bleeding.  Breast exam: No mass, nodules, thickening, tenderness, bulging, retraction, inflamation, nipple discharge or skin changes noted.  No axillary or clavicular LA.      Musculoskeletal: Normal range of motion. She exhibits no edema or tenderness.  Lymphadenopathy:    She has no cervical adenopathy.  Neurological: She is alert. She has normal reflexes. No cranial nerve deficit. She exhibits normal muscle tone. Coordination normal.  Skin: Skin is warm and dry. No  rash noted. No erythema. No pallor.  Psychiatric: She has a normal mood and affect.          Assessment & Plan:   Problem List Items Addressed This Visit      Endocrine   Hypothyroidism    Sees endocrinology -follows this and goiter Lab Results  Component Value Date   TSH 3.04 01/12/2016   No clinical changes         Other   Anemia, iron deficiency    Much improved with IV iron so far! Feels better Will try to control menorrhagia with OC also      Encounter for screening mammogram for breast cancer    Scheduled annual screening mammogram Nl breast exam today  Encouraged monthly self exams        Hyperlipidemia    Disc goals for lipids and reasons to control them Rev labs with pt- up likely due to inc in egg yolk consumption  Will work on that  Rev low sat fat diet in detail       Menorrhagia    Perimenopausal Will try OC for the short term- loestrin 1/20 Long discussion re: way to take OC properly and avoidance of smoking   (she will set alarm on her phone as a reminder) Risks of blood clots outlined as well as possible side eff Pt aware that this does not prevent stds and condoms should still be used inst that it may take up to 3 months for menses to fall into rhythm or side eff to stop  Adv to call if problems or questions        Routine general medical examination at a  health care facility - Primary    Reviewed health habits including diet and exercise and skin cancer prevention Reviewed appropriate screening tests for age  Also reviewed health mt list, fam hx and immunization status , as well as social and family history   See HPI Labs reviewed  Start the loestrin 1/20 -(generic) on Sunday- take every day at the same time- set an alarm on your cell phone to remind you  If any problems or side effects or if no improvement in periods in 2-3 months- let me know  Tdap vaccine today  Stop at check out for referral for mammogram  For cholesterol  (Avoid red meat/ fried foods/ egg yolks/ fatty breakfast meats/ butter, cheese and high fat dairy/ and shellfish)       Other Visit Diagnoses    Need for Tdap vaccination        Relevant Orders    Tdap vaccine greater than or equal to 7yo IM (Completed)

## 2016-02-19 DIAGNOSIS — N920 Excessive and frequent menstruation with regular cycle: Secondary | ICD-10-CM | POA: Insufficient documentation

## 2016-02-19 NOTE — Assessment & Plan Note (Signed)
Disc goals for lipids and reasons to control them Rev labs with pt- up likely due to inc in egg yolk consumption  Will work on that  Rev low sat fat diet in detail

## 2016-02-19 NOTE — Assessment & Plan Note (Signed)
Reviewed health habits including diet and exercise and skin cancer prevention Reviewed appropriate screening tests for age  Also reviewed health mt list, fam hx and immunization status , as well as social and family history   See HPI Labs reviewed  Start the loestrin 1/20 -(generic) on Sunday- take every day at the same time- set an alarm on your cell phone to remind you  If any problems or side effects or if no improvement in periods in 2-3 months- let me know  Tdap vaccine today  Stop at check out for referral for mammogram  For cholesterol  (Avoid red meat/ fried foods/ egg yolks/ fatty breakfast meats/ butter, cheese and high fat dairy/ and shellfish)

## 2016-02-19 NOTE — Assessment & Plan Note (Signed)
Sees endocrinology -follows this and goiter Lab Results  Component Value Date   TSH 3.04 01/12/2016   No clinical changes

## 2016-02-19 NOTE — Assessment & Plan Note (Signed)
Much improved with IV iron so far! Feels better Will try to control menorrhagia with OC also

## 2016-02-19 NOTE — Assessment & Plan Note (Signed)
Perimenopausal Will try OC for the short term- loestrin 1/20 Long discussion re: way to take OC properly and avoidance of smoking   (she will set alarm on her phone as a reminder) Risks of blood clots outlined as well as possible side eff Pt aware that this does not prevent stds and condoms should still be used inst that it may take up to 3 months for menses to fall into rhythm or side eff to stop  Adv to call if problems or questions

## 2016-02-19 NOTE — Assessment & Plan Note (Signed)
Scheduled annual screening mammogram Nl breast exam today  Encouraged monthly self exams   

## 2016-03-18 ENCOUNTER — Ambulatory Visit: Payer: BLUE CROSS/BLUE SHIELD

## 2016-06-07 ENCOUNTER — Other Ambulatory Visit: Payer: BLUE CROSS/BLUE SHIELD

## 2016-06-09 ENCOUNTER — Encounter: Payer: Self-pay | Admitting: Endocrinology

## 2016-06-09 ENCOUNTER — Ambulatory Visit (INDEPENDENT_AMBULATORY_CARE_PROVIDER_SITE_OTHER): Payer: BLUE CROSS/BLUE SHIELD | Admitting: Endocrinology

## 2016-06-09 VITALS — BP 110/70 | HR 80 | Ht 62.0 in | Wt 183.0 lb

## 2016-06-09 DIAGNOSIS — E038 Other specified hypothyroidism: Secondary | ICD-10-CM | POA: Diagnosis not present

## 2016-06-09 DIAGNOSIS — E063 Autoimmune thyroiditis: Secondary | ICD-10-CM

## 2016-06-09 LAB — T3, FREE: T3, Free: 6.9 pg/mL — ABNORMAL HIGH (ref 2.3–4.2)

## 2016-06-09 LAB — TSH: TSH: 3.15 u[IU]/mL (ref 0.35–4.50)

## 2016-06-09 LAB — T4, FREE: FREE T4: 0.56 ng/dL — AB (ref 0.60–1.60)

## 2016-06-09 NOTE — Progress Notes (Signed)
Patient ID: Carrie May, female   DOB: Feb 27, 1966, 50 y.o.   MRN: [id number]            Reason for Appointment:  Hypothyroidism, follow-up visit    History of Present Illness:   Hypothyroidism was first diagnosed in 2005 She was about 2 months postpartum at that time and she believes that her thyroid was checked on a routine basis However she had been starting to have more fatigue especially in her legs even during her pregnancy Also she was having some hair loss and tendency to weight gain  She was started on levothyroxine by an endocrinologist and followed for some time on various doses About 8-9 years ago when she moved here she was complaining about having continued fatigue and some hair loss with Synthroid and the local endocrinologist change her to Armour thyroid Records are not available from a previous history and labs; TPO antibody was increased at 844 She was followed with annual ultrasound exams also but the last 3 reports from 2012 until 2015 do not indicate any significant abnormality except irregular texture, minimal enlargement of the right lobe and no nodules  She thinks she has been on the same dose of 120 mg Armour Thyroid for about 2-3 years  Recent history:  Her thyroid dose has been unchanged including on her last visit except she was told to take only a half tablet once a week  She does not complain of any unexpected fatigue and overall feels fairly well now Has had some fluctuation in her weight, going up more recently  She did have a follow-up TSH after her last visit which was normal Has been very compliant with her medication before breakfast daily         Patient's weight history is as follows:  Wt Readings from Last 3 Encounters:  06/09/16 183 lb (83 kg)  02/18/16 173 lb 8 oz (78.7 kg)  12/09/15 168 lb 6.4 oz (76.4 kg)    Thyroid function results have been as follows:  Lab Results  Component Value Date   FREET4 0.56 (L) 06/09/2016   TSH  3.15 06/09/2016   TSH 3.04 01/12/2016   TSH 0.47 12/04/2015     Past Medical History:  Diagnosis Date  . Anemia    Intercurrent iron deficiency anemia  . Hyperlipidemia   . Thyroid disease     Past Surgical History:  Procedure Laterality Date  . TUBAL LIGATION      Family History  Problem Relation Age of Onset  . Hypothyroidism Mother   . Diabetes Maternal Grandmother   . Cancer Maternal Grandfather     throat cancer  . Hypothyroidism Sister   . Hypothyroidism Son     Hyperthyroidism    Social History:  reports that she has never smoked. She has never used smokeless tobacco. She reports that she drinks alcohol. She reports that she does not use drugs.  Allergies: No Known Allergies    Medication List       Accurate as of 06/09/16  4:13 PM. Always use your most recent med list.          norethindrone-ethinyl estradiol 1-20 MG-MCG tablet Commonly known as:  MICROGESTIN,JUNEL,LOESTRIN Take 1 tablet by mouth daily.   OVER THE COUNTER MEDICATION TAKES MULTIPLE VITAMINS TO HELP THYROID   thyroid 120 MG tablet Commonly known as:  ARMOUR Take 1 tablet (120 mg total) by mouth daily before breakfast.        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 Would like to change her to separate doses of levothyroxine 112 g daily and Cytomel 5 g, 2 tablets in the morning daily.  Needs to come back in 2 months with follow-up

## 2016-06-15 ENCOUNTER — Other Ambulatory Visit: Payer: Self-pay | Admitting: Endocrinology

## 2016-06-15 NOTE — Telephone Encounter (Signed)
Thyroid test is showing a high level of T3 and a low level of T4. Would like to change her to separate doses of levothyroxine 112 g daily and Cytomel 5 g, 2 tablets in the morning daily. Needs to come back in 2 months with follow-up with labs

## 2016-06-16 NOTE — Telephone Encounter (Signed)
I contacted the patient and advised of note via voicemail. Requested a call back if the patient would like to discuss. Prescriptions submitted.

## 2016-06-17 MED ORDER — LIOTHYRONINE SODIUM 5 MCG PO TABS: 5.0000 ug | ORAL_TABLET | Freq: Every day | ORAL | 1 refills | Status: DC

## 2016-06-17 MED ORDER — LEVOTHYROXINE SODIUM 112 MCG PO TABS: 112.0000 ug | ORAL_TABLET | Freq: Every day | ORAL | 1 refills | Status: DC

## 2016-12-07 ENCOUNTER — Other Ambulatory Visit: Payer: BLUE CROSS/BLUE SHIELD

## 2016-12-10 ENCOUNTER — Ambulatory Visit: Payer: BLUE CROSS/BLUE SHIELD | Admitting: Endocrinology

## 2016-12-10 ENCOUNTER — Other Ambulatory Visit (INDEPENDENT_AMBULATORY_CARE_PROVIDER_SITE_OTHER): Payer: BLUE CROSS/BLUE SHIELD

## 2016-12-10 DIAGNOSIS — E063 Autoimmune thyroiditis: Secondary | ICD-10-CM

## 2016-12-10 DIAGNOSIS — E038 Other specified hypothyroidism: Secondary | ICD-10-CM | POA: Diagnosis not present

## 2016-12-10 LAB — T3, FREE: T3, Free: 5 pg/mL — ABNORMAL HIGH (ref 2.3–4.2)

## 2016-12-10 LAB — TSH: TSH: 3.78 u[IU]/mL (ref 0.35–4.50)

## 2016-12-10 LAB — T4, FREE: Free T4: 0.62 ng/dL (ref 0.60–1.60)

## 2016-12-13 ENCOUNTER — Encounter: Payer: Self-pay | Admitting: Endocrinology

## 2016-12-13 ENCOUNTER — Ambulatory Visit (INDEPENDENT_AMBULATORY_CARE_PROVIDER_SITE_OTHER): Payer: BLUE CROSS/BLUE SHIELD | Admitting: Endocrinology

## 2016-12-13 VITALS — BP 126/80 | HR 68 | Ht 62.0 in | Wt 185.0 lb

## 2016-12-13 DIAGNOSIS — E038 Other specified hypothyroidism: Secondary | ICD-10-CM | POA: Diagnosis not present

## 2016-12-13 DIAGNOSIS — E063 Autoimmune thyroiditis: Secondary | ICD-10-CM

## 2016-12-13 NOTE — Progress Notes (Signed)
Patient ID: Carrie May, female   DOB: 1965/12/24, 51 y.o.   MRN: [id number]            Reason for Appointment:  Hypothyroidism, follow-up visit    History of Present Illness:   Hypothyroidism was first diagnosed in 2005 She was about 2 months postpartum at that time and she believes that her thyroid was checked on a routine basis However she had been starting to have more fatigue especially in her legs even during her pregnancy Also she was having some hair loss and tendency to weight gain  She was started on levothyroxine by an endocrinologist and followed for some time on various doses About 8-9 years ago when she moved here she was complaining about having continued fatigue and some hair loss with Synthroid and the local endocrinologist changed her to Armour thyroid Records are not available from a previous history and labs; TPO antibody was increased at 844 She was followed with annual ultrasound exams also but the last 3 reports from 2012 until 2015 do not indicate any significant abnormality except irregular texture, minimal enlargement of the right lobe and no nodules She had been on the same dose of 120 mg Armour Thyroid for about 2-3 years  Recent history:  Her regimen was changed from Armour Thyroid 120 mg to taking levothyroxine and Cytomel separately because of high free T3 level of 6.9 in 8/17  Currently taking 5 g of Cytomel in the morning along with levothyroxine 112 g She did not come back for follow-up in a couple of months as directed However she continues to feel fairly good without any unusual fatigue similar to how she was feeling on Armour Thyroid She does think that her hair loss is less more recently Has had some fluctuation in her weight, going up more since last visit  Has been very compliant with her medications before breakfast daily         Patient's weight history is as follows:  Wt Readings from Last 3 Encounters:  12/13/16 185 lb (83.9 kg)    06/09/16 183 lb (83 kg)  02/18/16 173 lb 8 oz (78.7 kg)    Thyroid function results have been as follows:  Lab Results  Component Value Date   FREET4 0.62 12/10/2016   FREET4 0.56 (L) 06/09/2016   TSH 3.78 12/10/2016   TSH 3.15 06/09/2016   TSH 3.04 01/12/2016   Lab Results  Component Value Date   T3FREE 5.0 (H) 12/10/2016   T3FREE 6.9 (H) 06/09/2016     Past Medical History:  Diagnosis Date  . Anemia    Intercurrent iron deficiency anemia  . Hyperlipidemia   . Thyroid disease     Past Surgical History:  Procedure Laterality Date  . TUBAL LIGATION      Family History  Problem Relation Age of Onset  . Hypothyroidism Mother   . Diabetes Maternal Grandmother   . Cancer Maternal Grandfather     throat cancer  . Hypothyroidism Sister   . Hypothyroidism Son     Hyperthyroidism    Social History:  reports that she has never smoked. She has never used smokeless tobacco. She reports that she drinks alcohol. She reports that she does not use drugs.  Allergies: No Known Allergies  Allergies as of 12/13/2016   No Known Allergies     Medication List       Accurate as of 12/13/16  3:40 PM. Always use your most recent med list.  levothyroxine 112 MCG tablet Commonly known as:  SYNTHROID, LEVOTHROID Take 1 tablet (112 mcg total) by mouth daily.   liothyronine 5 MCG tablet Commonly known as:  CYTOMEL Take 1 tablet (5 mcg total) by mouth daily.   norethindrone-ethinyl estradiol 1-20 MG-MCG tablet Commonly known as:  MICROGESTIN,JUNEL,LOESTRIN Take 1 tablet by mouth daily.   OVER THE COUNTER MEDICATION TAKES MULTIPLE VITAMINS TO HELP THYROID   thyroid 120 MG tablet Commonly known as:  ARMOUR Take 1 tablet (120 mg total) by mouth daily before breakfast.        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

## 2017-01-03 ENCOUNTER — Other Ambulatory Visit: Payer: Self-pay | Admitting: Endocrinology

## 2017-02-22 ENCOUNTER — Encounter: Payer: Self-pay | Admitting: Gastroenterology

## 2017-02-22 ENCOUNTER — Encounter: Payer: Self-pay | Admitting: Family Medicine

## 2017-02-22 ENCOUNTER — Telehealth: Payer: Self-pay | Admitting: *Deleted

## 2017-02-22 ENCOUNTER — Ambulatory Visit (INDEPENDENT_AMBULATORY_CARE_PROVIDER_SITE_OTHER): Payer: BLUE CROSS/BLUE SHIELD | Admitting: Family Medicine

## 2017-02-22 VITALS — BP 116/60 | HR 58 | Temp 98.3°F | Ht 62.25 in | Wt 181.2 lb

## 2017-02-22 DIAGNOSIS — Z1231 Encounter for screening mammogram for malignant neoplasm of breast: Secondary | ICD-10-CM

## 2017-02-22 DIAGNOSIS — Z0000 Encounter for general adult medical examination without abnormal findings: Secondary | ICD-10-CM | POA: Diagnosis not present

## 2017-02-22 DIAGNOSIS — E7800 Pure hypercholesterolemia, unspecified: Secondary | ICD-10-CM | POA: Diagnosis not present

## 2017-02-22 DIAGNOSIS — Z01419 Encounter for gynecological examination (general) (routine) without abnormal findings: Secondary | ICD-10-CM | POA: Insufficient documentation

## 2017-02-22 DIAGNOSIS — D500 Iron deficiency anemia secondary to blood loss (chronic): Secondary | ICD-10-CM

## 2017-02-22 DIAGNOSIS — E030 Congenital hypothyroidism with diffuse goiter: Secondary | ICD-10-CM

## 2017-02-22 DIAGNOSIS — Z1211 Encounter for screening for malignant neoplasm of colon: Secondary | ICD-10-CM

## 2017-02-22 DIAGNOSIS — N924 Excessive bleeding in the premenopausal period: Secondary | ICD-10-CM

## 2017-02-22 LAB — COMPREHENSIVE METABOLIC PANEL
ALBUMIN: 4.3 g/dL (ref 3.5–5.2)
ALK PHOS: 53 U/L (ref 39–117)
ALT: 8 U/L (ref 0–35)
AST: 14 U/L (ref 0–37)
BILIRUBIN TOTAL: 0.3 mg/dL (ref 0.2–1.2)
BUN: 10 mg/dL (ref 6–23)
CO2: 29 mEq/L (ref 19–32)
Calcium: 9.3 mg/dL (ref 8.4–10.5)
Chloride: 106 mEq/L (ref 96–112)
Creatinine, Ser: 0.86 mg/dL (ref 0.40–1.20)
GFR: 89.66 mL/min (ref >60.00)
GLUCOSE: 87 mg/dL (ref 70–99)
Potassium: 3.8 mEq/L (ref 3.5–5.1)
SODIUM: 139 meq/L (ref 135–145)
TOTAL PROTEIN: 7.4 g/dL (ref 6.0–8.3)

## 2017-02-22 LAB — CBC WITH DIFFERENTIAL/PLATELET
BASOS ABS: 0.2 10*3/uL — AB (ref 0.0–0.1)
Basophils Relative: 2.6 % (ref 0.0–3.0)
EOS PCT: 1.4 % (ref 0.0–5.0)
Eosinophils Absolute: 0.1 10*3/uL (ref 0.0–0.7)
HCT: 26.3 % — ABNORMAL LOW (ref 36.0–46.0)
Hemoglobin: 7.7 g/dL — CL (ref 12.0–15.0)
LYMPHS ABS: 1.3 10*3/uL (ref 0.7–4.0)
Lymphocytes Relative: 20.6 % (ref 12.0–46.0)
MONO ABS: 0.3 10*3/uL (ref 0.1–1.0)
MONOS PCT: 4.5 % (ref 3.0–12.0)
NEUTROS PCT: 70.9 % (ref 43.0–77.0)
Neutro Abs: 4.4 10*3/uL (ref 1.4–7.7)
Platelets: 240 10*3/uL (ref 150.0–400.0)
RBC: 3.86 Mil/uL — AB (ref 3.87–5.11)
RDW: 17.8 % — ABNORMAL HIGH (ref 11.5–15.5)
WBC: 6.3 10*3/uL (ref 4.0–10.5)

## 2017-02-22 LAB — LIPID PANEL
CHOL/HDL RATIO: 5
CHOLESTEROL: 197 mg/dL (ref 0–200)
HDL: 40.9 mg/dL (ref >39.00)
LDL Cholesterol: 140 mg/dL — ABNORMAL HIGH (ref 0–99)
NONHDL: 155.61
Triglycerides: 77 mg/dL (ref 0.0–149.0)
VLDL: 15.4 mg/dL (ref 0.0–40.0)

## 2017-02-22 NOTE — Assessment & Plan Note (Addendum)
Reviewed health habits including diet and exercise and skin cancer prevention Reviewed appropriate screening tests for age  Also reviewed health mt list, fam hx and immunization status , as well as social and family history   See HPI Labs ordered for wellness Ref for mammogram  Ref for first screening colonoscopy Disc self care (sleep is hard to get on 3rd shift) Disc need for wt loss/ she is working on it  Plan fore exercise  Declines flu shots

## 2017-02-22 NOTE — Progress Notes (Signed)
Subjective:    Patient ID: Carrie May, female    DOB: 1966-02-08, 51 y.o.   MRN: [id number]  HPI Here for health maintenance exam and to review chronic medical problems   Working a lot  Doing well  Will have some time off this summer  Feeling good overall  Taking care of herself   Wt Readings from Last 3 Encounters:  02/22/17 181 lb 4 oz (82.2 kg)  12/13/16 185 lb (83.9 kg)  06/09/16 183 lb (83 kg)  working on weight loss  She tried a vinegar drink -that seems to help her  Eating better- more salads Very active with 2 young children  bmi 32.8  Mammogram 2/13-needs a referral for next one  Just has not had time  Self breast exam- no lumps   Colonoscopy/ colon cancer screen Wants to get referred for that   Flu shot - did not get/she does not get them   Pap 6/16 neg with neg HPV from GYN Last time she had menorrhagia from perimenopause Periods are much less heavy now  Still some clots  Gets period every month  Did not end up needing OC - the problem improved on its own  Wants to do her pap today - just finished menses   Was anemic from periods in the past  Not on iron currently (still has iron pills if she needs them)  Not more fatigued    Tetanus shot 5/17  Hx of hypothyroidism ([place]ot[place]) Sees Dr Lucianne Muss  On levothyroxine and cytomel Lab Results  Component Value Date   TSH 3.78 12/10/2016      Hx of hyperlipidemia  Lab Results  Component Value Date   CHOL 213 (H) 01/12/2016   CHOL 176 04/04/2015   CHOL 211 (H) 11/30/2011   Lab Results  Component Value Date   HDL 50.10 01/12/2016   HDL 45 (L) 04/04/2015   HDL 50 11/30/2011   Lab Results  Component Value Date   LDLCALC 144 (H) 01/12/2016   LDLCALC 120 (H) 04/04/2015   LDLCALC 143 (H) 11/30/2011   Lab Results  Component Value Date   TRIG 99.0 01/12/2016   TRIG 57 04/04/2015   TRIG 91 11/30/2011   Lab Results  Component Value Date   CHOLHDL 4 01/12/2016   CHOLHDL 3.9 04/04/2015   CHOLHDL 4.2 11/30/2011   No results found for: LDLDIRECT Diet controlled Due for labs today along with wellness labs  Diet is a bit better -hopes cholesterol will look better     Patient Active Problem List   Diagnosis Date Noted  . Colon cancer screening 02/22/2017  . Encounter for annual routine gynecological examination 02/22/2017  . Menorrhagia 02/19/2016  . Encounter for screening mammogram for breast cancer 02/18/2016  . Routine general medical examination at a health care facility 01/11/2016  . 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 Anemia, iron deficiency 08/25/2015  . Goiter 05/30/2007  . Hypothyroidism 05/30/2007  . 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 Hyperlipidemia 05/30/2007   Past Medical History:  Diagnosis Date  . Anemia    Intercurrent iron deficiency anemia  . Hyperlipidemia   . Thyroid disease    Past Surgical History:  Procedure Laterality Date  . TUBAL LIGATION     Social History  Substance Use Topics  . Smoking status: Never Smoker  . Smokeless tobacco: Never Used  . Alcohol use 0.0 oz/week     Comment: occasion   Family History  Problem Relation Age of Onset  . Hypothyroidism  Mother   . Diabetes Maternal Grandmother   . Cancer Maternal Grandfather     throat cancer  . Hypothyroidism Sister   . Hypothyroidism Son     Hyperthyroidism   No Known Allergies Current Outpatient Prescriptions on File Prior to Visit  Medication Sig Dispense Refill  . levothyroxine (SYNTHROID, LEVOTHROID) 112 MCG tablet TAKE 1 TABLET BY MOUTH DAILY 30 tablet 2  . liothyronine (CYTOMEL) 5 MCG tablet TAKE 1 TABLET BY MOUTH DAILY 30 tablet 2   No current facility-administered medications on file prior to visit.     Review of Systems Review of Systems  Constitutional: Negative for fever, appetite change,  and unexpected weight change. pos for fatigue on and off  Eyes: Negative for pain and visual disturbance.  Respiratory: Negative for cough and  shortness of breath.   Cardiovascular: Negative for cp or palpitations    Gastrointestinal: Negative for nausea, diarrhea and constipation.  Genitourinary: Negative for urgency and frequency.  Skin: Negative for pallor or rash   Neurological: Negative for weakness, light-headedness, numbness and headaches.  Hematological: Negative for adenopathy. Does not bruise/bleed easily.  Psychiatric/Behavioral: Negative for dysphoric mood. The patient is not nervous/anxious.         Objective:   Physical Exam  Constitutional: She appears well-developed and well-nourished. No distress.  overwt and well appearing   HENT:  Head: Normocephalic and atraumatic.  Right Ear: External ear normal.  Left Ear: External ear normal.  Mouth/Throat: Oropharynx is clear and moist.  Eyes: Conjunctivae and EOM are normal. Pupils are equal, round, and reactive to light. No scleral icterus.  Neck: Normal range of motion. Neck supple. No JVD present. Carotid bruit is not present. No thyromegaly present.  Cardiovascular: Normal rate, regular rhythm, normal heart sounds and intact distal pulses.  Exam reveals no gallop.   Pulmonary/Chest: Effort normal and breath sounds normal. No respiratory distress. She has no wheezes. She exhibits no tenderness.  Abdominal: Soft. Bowel sounds are normal. She exhibits no distension, no abdominal bruit and no mass. There is no tenderness.  Genitourinary: No breast swelling, tenderness, discharge or bleeding.  Genitourinary Comments: Breast exam: No mass, nodules, thickening, tenderness, bulging, retraction, inflamation, nipple discharge or skin changes noted.  No axillary or clavicular LA.             Anus appears normal w/o hemorrhoids or masses     External genitalia : nl appearance and hair distribution/no lesions     Urethral meatus : nl size, no lesions or prolapse     Urethra: no masses, tenderness or scarring    Bladder : no masses or tenderness     Vagina:  nl general appearance, no discharge or  Lesions, no significant cystocele  or rectocele     Cervix: no lesions/ discharge or friability (scant blood at os from recent menses)    Uterus: nl size, contour, position, and mobility (not fixed) , non tender    Adnexa : no masses, tenderness, enlargement or nodularity        Musculoskeletal: Normal range of motion. She exhibits no edema or tenderness.  Lymphadenopathy:    She has no cervical adenopathy.  Neurological: She is alert. She has normal reflexes. No cranial nerve deficit. She exhibits normal muscle tone. Coordination normal.  Skin: Skin is warm and dry. No rash noted. No erythema. No pallor.  Few brown nevi  Psychiatric: She has a normal mood and affect.          Assessment &  Plan:   Problem List Items Addressed This Visit      Endocrine   Hypothyroidism - Primary    Sees Dr Marissa Calamity on levothyroxine and cytomel Lab Results  Component Value Date   TSH 3.78 12/10/2016           Other   Anemia, iron deficiency    In the past from heavy menses Per pt this has slowed down Not currently taking iron  Cbc today      Colon cancer screening    Referred for first screening colonoscopy at age 16      Relevant Orders   Ambulatory referral to Gastroenterology   Encounter for annual routine gynecological examination    Routine exam and pap  Some spotting from menses noted Pt states menses is improved        Relevant Orders   Cytology - PAP   Encounter for screening mammogram for breast cancer    Scheduled annual screening mammogram Nl breast exam today  Encouraged monthly self exams        Relevant Orders   MM DIGITAL SCREENING BILATERAL   Hyperlipidemia    Disc goals for lipids and reasons to control them Rev labs with pt  (last check)  Rev low sat fat diet in detail  Diet is a bit better this time       Menorrhagia    Per pt this has slowed down  Still has regular menses  Never took the  OC      Routine general medical examination at a health care facility    Reviewed health habits including diet and exercise and skin cancer prevention Reviewed appropriate screening tests for age  Also reviewed health mt list, fam hx and immunization status , as well as social and family history   See HPI Labs ordered for wellness Ref for mammogram  Ref for first screening colonoscopy Disc self care (sleep is hard to get on 3rd shift) Disc need for wt loss/ she is working on it  Plan fore exercise  Declines flu shots      Relevant Orders   CBC with Differential/Platelet   Comprehensive metabolic panel   Lipid panel

## 2017-02-22 NOTE — Assessment & Plan Note (Signed)
Per pt this has slowed down  Still has regular menses  Never took the OC

## 2017-02-22 NOTE — Assessment & Plan Note (Signed)
Disc goals for lipids and reasons to control them Rev labs with pt  (last check)  Rev low sat fat diet in detail  Diet is a bit better this time

## 2017-02-22 NOTE — Telephone Encounter (Signed)
Please let pt know she has severe iron deficiency again - worse than before with Hb of 7.7  I assume from menses  Start back on her iron bid (please confirm what she has)  If she becomes tired/dizzy or dev cp alert us  f/u in 1 mo for visit with me and re check

## 2017-02-22 NOTE — Telephone Encounter (Signed)
Left voicemail requesting pt to call the office back 

## 2017-02-22 NOTE — Assessment & Plan Note (Signed)
Scheduled annual screening mammogram Nl breast exam today  Encouraged monthly self exams   

## 2017-02-22 NOTE — Telephone Encounter (Signed)
CRITICAL VALUE STICKER  CRITICAL VALUE: 7.7   RECEIVER: Natasha Chavers. CMA  DATE & TIME NOTIFIED: 02/22/17 1:47  MESSENGER Paris LoreGil Molina  MD NOTIFIED: Dr Milinda Antisower  TIME OF NOTIFICATION: 1:49  RESPONSE:

## 2017-02-22 NOTE — Assessment & Plan Note (Signed)
Referred for first screening colonoscopy at age 51

## 2017-02-22 NOTE — Progress Notes (Signed)
Pre visit review using our clinic review tool, if applicable. No additional management support is needed unless otherwise documented below in the visit note. 

## 2017-02-22 NOTE — Assessment & Plan Note (Signed)
In the past from heavy menses Per pt this has slowed down Not currently taking iron  Cbc today

## 2017-02-22 NOTE — Assessment & Plan Note (Signed)
Sees Dr Marissa CalamityKumar Stable on levothyroxine and cytomel Lab Results  Component Value Date   TSH 3.78 12/10/2016

## 2017-02-22 NOTE — Assessment & Plan Note (Signed)
Routine exam and pap  Some spotting from menses noted Pt states menses is improved

## 2017-02-22 NOTE — Patient Instructions (Addendum)
Stop at check out for referral for mammogram   Also we will refer you for first screening colonoscopy   Labs today   Take care of yourself  Get sleep when you can  Try to eat a healthy diet  For cholesterol   Avoid red meat/ fried foods/ egg yolks/ fatty breakfast meats/ butter, cheese and high fat dairy/ and shellfish    Pap and gyn exam done today  If your periods become a problem again please alert us

## 2017-02-23 NOTE — Telephone Encounter (Signed)
Left 2nd voicemail requesting pt to call the office back 

## 2017-02-24 ENCOUNTER — Other Ambulatory Visit: Payer: Self-pay | Admitting: Family Medicine

## 2017-02-25 LAB — CYTOLOGY - PAP
ADEQUACY: ABSENT
DIAGNOSIS: NEGATIVE

## 2017-02-25 NOTE — Telephone Encounter (Signed)
Left message requesting pt to call office back on main # and call alt # and no answer and no voicemail set up

## 2017-03-08 NOTE — Telephone Encounter (Signed)
Addressed through results notes  

## 2017-04-04 ENCOUNTER — Ambulatory Visit: Payer: BLUE CROSS/BLUE SHIELD

## 2017-04-04 ENCOUNTER — Encounter: Payer: Self-pay | Admitting: Family Medicine

## 2017-04-04 ENCOUNTER — Ambulatory Visit (INDEPENDENT_AMBULATORY_CARE_PROVIDER_SITE_OTHER): Payer: BLUE CROSS/BLUE SHIELD | Admitting: Family Medicine

## 2017-04-04 VITALS — BP 112/78 | HR 66 | Temp 99.0°F | Ht 62.25 in | Wt 182.8 lb

## 2017-04-04 DIAGNOSIS — E7800 Pure hypercholesterolemia, unspecified: Secondary | ICD-10-CM | POA: Diagnosis not present

## 2017-04-04 DIAGNOSIS — N924 Excessive bleeding in the premenopausal period: Secondary | ICD-10-CM | POA: Diagnosis not present

## 2017-04-04 DIAGNOSIS — D500 Iron deficiency anemia secondary to blood loss (chronic): Secondary | ICD-10-CM

## 2017-04-04 NOTE — Patient Instructions (Signed)
For cholesterol  Avoid red meat/ fried foods/ egg yolks/ fatty breakfast meats/ butter, cheese and high fat dairy/ and shellfish    Take your iron twice daily  Take a stool softener if it constipates you  Drink lots of water  Eat dark leafy greens  Labs today for blood count and iron   Look at the literature on the hormone IUD-if interested we can refer you to gyn to discuss it   Take care of yourself

## 2017-04-04 NOTE — Progress Notes (Signed)
Subjective:    Patient ID: Carrie May, female    DOB: [birth year], 51 y.o.   MRN: [id number]  HPI Here for f/u of severe anemia  Thought to be due to menses  Lab Results  Component Value Date   WBC 6.3 02/22/2017   HGB 7.7 Repeated and verified X2. (LL) 02/22/2017   HCT 26.3 Repeated and verified X2. (L) 02/22/2017   MCV 68.1 Repeated and verified X2. (L) 02/22/2017   PLT 240.0 02/22/2017   Px iron bid  Pt states she thought it was once daily - now realizes  No hx of thal or of [place] trait    Wt Readings from Last 3 Encounters:  04/04/17 182 lb 12 oz (82.9 kg)  02/22/17 181 lb 4 oz (82.2 kg)  12/13/16 185 lb (83.9 kg)   bmi  33.1   Cholesterol was also high at last appt Lab Results  Component Value Date   CHOL 197 02/22/2017   HDL 40.90 02/22/2017   LDLCALC 140 (H) 02/22/2017   TRIG 77.0 02/22/2017   CHOLHDL 5 02/22/2017    Menses :  Regular Better than before (used to be worse) Not as much clotting as in the past  Last 5 days on average -- 3 of those days are heavy  approx 6 pads per day -no longer leaks through them  Told she had ? Fibroids but then discounted that   She does not want to be on birth control (does not think she could remember to take it)  She is interested in IUD    Patient Active Problem List   Diagnosis Date Noted  . Colon cancer screening 02/22/2017  . Encounter for annual routine gynecological examination 02/22/2017  . Menorrhagia 02/19/2016  . Encounter for screening mammogram for breast cancer 02/18/2016  . Routine general medical examination at a health care facility 01/11/2016  . 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 Anemia, iron deficiency 08/25/2015  . Goiter 05/30/2007  . Hypothyroidism 05/30/2007  . 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 Hyperlipidemia 05/30/2007   Past Medical History:  Diagnosis Date  . Anemia    Intercurrent iron deficiency anemia  . Hyperlipidemia   . Thyroid disease    Past  Surgical History:  Procedure Laterality Date  . TUBAL LIGATION     Social History  Substance Use Topics  . Smoking status: Never Smoker  . Smokeless tobacco: Never Used  . Alcohol use 0.0 oz/week     Comment: occasion   Family History  Problem Relation Age of Onset  . Hypothyroidism Mother   . Diabetes Maternal Grandmother   . Cancer Maternal Grandfather        throat cancer  . Hypothyroidism Sister   . Hypothyroidism Son        Hyperthyroidism   No Known Allergies Current Outpatient Prescriptions on File Prior to Visit  Medication Sig Dispense Refill  . ferrous sulfate 325 (65 FE) MG tablet Take 325 mg by mouth 2 (two) times daily with a meal.    . levothyroxine (SYNTHROID, LEVOTHROID) 112 MCG tablet TAKE 1 TABLET BY MOUTH DAILY 30 tablet 2  . liothyronine (CYTOMEL) 5 MCG tablet TAKE 1 TABLET BY MOUTH DAILY 30 tablet 2   No current facility-administered medications on file prior to visit.     Review of Systems Review of Systems  Constitutional: Negative for fever, appetite change,  and unexpected weight change. pos for fatigue Eyes: Negative for pain and visual disturbance.  Respiratory: Negative for cough and  shortness of breath.   Cardiovascular: Negative for cp or palpitations    Gastrointestinal: Negative for nausea, diarrhea and constipation.  Genitourinary: Negative for urgency and frequency.  Skin: Negative for pallor or rash   Neurological: Negative for weakness, light-headedness, numbness and headaches. neg for RLS symptoms  Hematological: Negative for adenopathy. Does not bruise/bleed easily.  Psychiatric/Behavioral: Negative for dysphoric mood. The patient is not nervous/anxious.         Objective:   Physical Exam  Constitutional: She appears well-developed and well-nourished. No distress.  HENT:  Head: Normocephalic and atraumatic.  Mouth/Throat: Oropharynx is clear and moist.  Eyes: Conjunctivae and EOM are normal. Pupils are equal, round, and reactive  to light.  Neck: Normal range of motion. Neck supple. No JVD present. Carotid bruit is not present. No thyromegaly present.  Cardiovascular: Normal rate, regular rhythm, normal heart sounds and intact distal pulses.  Exam reveals no gallop.   Pulmonary/Chest: Effort normal and breath sounds normal. No respiratory distress. She has no wheezes. She has no rales.  No crackles  Abdominal: Soft. Bowel sounds are normal. She exhibits no distension, no abdominal bruit and no mass. There is no tenderness.  No suprapubic tenderness or fullness    Musculoskeletal: She exhibits no edema or tenderness.  Lymphadenopathy:    She has no cervical adenopathy.  Neurological: She is alert. She has normal reflexes.  Skin: Skin is warm and dry. No rash noted.  Mild pallor of palms   Psychiatric: She has a normal mood and affect.          Assessment & Plan:   Problem List Items Addressed This Visit      Other   Anemia, iron deficiency - Primary    Severe with Hb of 7.7-suspect due to menses  Taking iron daily - adv to inc to every other day  Disc iron rich foods  Declines OC_ given info to read about hormonal iud  Lab today for cbc/iron/ferritin       Relevant Orders   CBC with Differential/Platelet   Ferritin   Iron   Hyperlipidemia    Disc goals for lipids and reasons to control them Rev labs with pt Rev low sat fat diet in detail Info given on diet change-she is working on it      Menorrhagia    Disc opt for tx  Has caused severe anemia  She states she cannot remember pill  IUD may be good option -given info  Vaginal ring/ hormonal implant may also be options She will consider this and update when ready for gyn referral

## 2017-04-04 NOTE — Assessment & Plan Note (Signed)
Severe with Hb of 7.7-suspect due to menses  Taking iron daily - adv to inc to every other day  Disc iron rich foods  Declines OC_ given info to read about hormonal iud  Lab today for cbc/iron/ferritin

## 2017-04-04 NOTE — Assessment & Plan Note (Signed)
Disc opt for tx  Has caused severe anemia  She states she cannot remember pill  IUD may be good option -given info  Vaginal ring/ hormonal implant may also be options She will consider this and update when ready for gyn referral

## 2017-04-04 NOTE — Assessment & Plan Note (Signed)
Disc goals for lipids and reasons to control them Rev labs with pt Rev low sat fat diet in detail Info given on diet change-she is working on it

## 2017-04-05 ENCOUNTER — Telehealth: Payer: Self-pay | Admitting: *Deleted

## 2017-04-05 LAB — CBC WITH DIFFERENTIAL/PLATELET
BASOS PCT: 0.6 % (ref 0.0–3.0)
Basophils Absolute: 0 10*3/uL (ref 0.0–0.1)
EOS ABS: 0.1 10*3/uL (ref 0.0–0.7)
EOS PCT: 1.2 % (ref 0.0–5.0)
HCT: 27.2 % — ABNORMAL LOW (ref 36.0–46.0)
Hemoglobin: 7.9 g/dL — CL (ref 12.0–15.0)
Lymphocytes Relative: 13.9 % (ref 12.0–46.0)
Lymphs Abs: 1.1 10*3/uL (ref 0.7–4.0)
MCHC: 28.8 g/dL — AB (ref 30.0–36.0)
MCV: 66.1 fl — ABNORMAL LOW (ref 78.0–100.0)
MONO ABS: 0.6 10*3/uL (ref 0.1–1.0)
Monocytes Relative: 7.4 % (ref 3.0–12.0)
NEUTROS ABS: 5.8 10*3/uL (ref 1.4–7.7)
Neutrophils Relative %: 76.9 % (ref 43.0–77.0)
PLATELETS: 218 10*3/uL (ref 150.0–400.0)
RBC: 4.12 Mil/uL (ref 3.87–5.11)
RDW: 18.2 % — AB (ref 11.5–15.5)
WBC: 7.6 10*3/uL (ref 4.0–10.5)

## 2017-04-05 LAB — FERRITIN: FERRITIN: 2.1 ng/mL — AB (ref 10.0–291.0)

## 2017-04-05 LAB — IRON: IRON: 10 ug/dL — AB (ref 42–145)

## 2017-04-05 NOTE — Telephone Encounter (Signed)
Aware-pending ferritin, this is not much change from last time Will adv when ferritin returns

## 2017-04-05 NOTE — Telephone Encounter (Signed)
CRITICAL VALUE STICKER  CRITICAL VALUE:HGB 7.9  RECEIVER: Liane ComberNatasha Chavers, CMA  DATE & TIME NOTIFIED: 04/05/17@11 :9136   MESSENGER : Mariea ClontsKaren W  MD NOTIFIED: [place]   TIME OF NOTIFICATION: 11:39  RESPONSE:

## 2017-04-25 ENCOUNTER — Encounter: Payer: BLUE CROSS/BLUE SHIELD | Admitting: Gastroenterology

## 2017-04-26 ENCOUNTER — Telehealth: Payer: Self-pay | Admitting: Family Medicine

## 2017-04-26 DIAGNOSIS — D500 Iron deficiency anemia secondary to blood loss (chronic): Secondary | ICD-10-CM

## 2017-04-26 DIAGNOSIS — N924 Excessive bleeding in the premenopausal period: Secondary | ICD-10-CM

## 2017-04-26 NOTE — Telephone Encounter (Signed)
-----   Message from Shon MilletShapale M [place], [place] sent at 04/26/2017  4:35 PM EDT ----- Pt notified of lab results and Dr. Royden Purlower's comments. Pt does agree with referral to gyn please put referral in and I advise [facility]t, and f/u lab appt scheduled

## 2017-04-26 NOTE — Telephone Encounter (Signed)
Pt returned call regarding labs cb number is [phone] Thanks

## 2017-04-26 NOTE — Telephone Encounter (Signed)
Ref done-will route to [facility] 

## 2017-04-26 NOTE — Telephone Encounter (Signed)
Addressed through result notes  

## 2017-04-27 ENCOUNTER — Other Ambulatory Visit: Payer: Self-pay | Admitting: *Deleted

## 2017-04-27 DIAGNOSIS — D500 Iron deficiency anemia secondary to blood loss (chronic): Secondary | ICD-10-CM

## 2017-04-27 NOTE — Telephone Encounter (Signed)
Appt made with Dr Belva AgeeElise Leger on 05/06/17 at 11:00am, patient is aware.

## 2017-05-02 ENCOUNTER — Other Ambulatory Visit (INDEPENDENT_AMBULATORY_CARE_PROVIDER_SITE_OTHER): Payer: BLUE CROSS/BLUE SHIELD

## 2017-05-02 ENCOUNTER — Telehealth: Payer: Self-pay | Admitting: *Deleted

## 2017-05-02 DIAGNOSIS — D500 Iron deficiency anemia secondary to blood loss (chronic): Secondary | ICD-10-CM

## 2017-05-02 LAB — CBC WITH DIFFERENTIAL/PLATELET
BASOS ABS: 0.1 10*3/uL (ref 0.0–0.1)
Basophils Relative: 2 % (ref 0.0–3.0)
Eosinophils Absolute: 0.1 10*3/uL (ref 0.0–0.7)
Eosinophils Relative: 2.4 % (ref 0.0–5.0)
HCT: 25.6 % — ABNORMAL LOW (ref 36.0–46.0)
Hemoglobin: 7.3 g/dL — CL (ref 12.0–15.0)
LYMPHS ABS: 1.1 10*3/uL (ref 0.7–4.0)
Lymphocytes Relative: 20 % (ref 12.0–46.0)
MCHC: 28.7 g/dL — ABNORMAL LOW (ref 30.0–36.0)
MCV: 66 fl — ABNORMAL LOW (ref 78.0–100.0)
MONO ABS: 0.4 10*3/uL (ref 0.1–1.0)
MONOS PCT: 6.8 % (ref 3.0–12.0)
NEUTROS ABS: 3.8 10*3/uL (ref 1.4–7.7)
NEUTROS PCT: 68.8 % (ref 43.0–77.0)
PLATELETS: 194 10*3/uL (ref 150.0–400.0)
RBC: 3.88 Mil/uL (ref 3.87–5.11)
RDW: 18.1 % — ABNORMAL HIGH (ref 11.5–15.5)
WBC: 5.5 10*3/uL (ref 4.0–10.5)

## 2017-05-02 LAB — FERRITIN: FERRITIN: 2.2 ng/mL — AB (ref 10.0–291.0)

## 2017-05-02 NOTE — Telephone Encounter (Signed)
Received critical lab call from [facility]. Pt's hemoglobin was 7.3, and hematocrit was 25.6, Dr. Milinda Antisower notified

## 2017-05-02 NOTE — Telephone Encounter (Signed)
No improvement but fairly stable on iron -continue it  See gyn on 7/20 as planned -hopefully if we can control menses we can make progress with the blood count

## 2017-05-03 NOTE — Telephone Encounter (Signed)
Pt notified of lab results and Dr. Royden Purlower's comments. Pt will keep GYN appt on 7/20 as planned

## 2017-05-11 ENCOUNTER — Other Ambulatory Visit: Payer: Self-pay | Admitting: Family Medicine

## 2017-06-10 ENCOUNTER — Other Ambulatory Visit: Payer: BLUE CROSS/BLUE SHIELD

## 2017-06-15 ENCOUNTER — Ambulatory Visit: Payer: BLUE CROSS/BLUE SHIELD | Admitting: Endocrinology

## 2017-06-23 ENCOUNTER — Other Ambulatory Visit: Payer: Self-pay | Admitting: Endocrinology

## 2017-07-11 ENCOUNTER — Other Ambulatory Visit (INDEPENDENT_AMBULATORY_CARE_PROVIDER_SITE_OTHER): Payer: BLUE CROSS/BLUE SHIELD

## 2017-07-11 DIAGNOSIS — E063 Autoimmune thyroiditis: Secondary | ICD-10-CM

## 2017-07-11 LAB — T3, FREE: T3, Free: 4.9 pg/mL — ABNORMAL HIGH (ref 2.3–4.2)

## 2017-07-11 LAB — TSH: TSH: 3.94 u[IU]/mL (ref 0.35–4.50)

## 2017-07-11 LAB — T4, FREE: FREE T4: 0.68 ng/dL (ref 0.60–1.60)

## 2017-07-18 ENCOUNTER — Encounter: Payer: Self-pay | Admitting: Endocrinology

## 2017-07-18 ENCOUNTER — Ambulatory Visit (INDEPENDENT_AMBULATORY_CARE_PROVIDER_SITE_OTHER): Payer: BLUE CROSS/BLUE SHIELD | Admitting: Endocrinology

## 2017-07-18 VITALS — BP 110/78 | HR 77 | Ht 62.25 in | Wt 188.0 lb

## 2017-07-18 DIAGNOSIS — E063 Autoimmune thyroiditis: Secondary | ICD-10-CM

## 2017-07-18 MED ORDER — LEVOTHYROXINE SODIUM 137 MCG PO TABS: 137.0000 ug | ORAL_TABLET | Freq: Every day | ORAL | 2 refills | Status: DC

## 2017-07-18 NOTE — Progress Notes (Signed)
Patient ID: Carrie May, female   DOB: 10/06/1966, 51 y.o.   MRN: [id number]            Reason for Appointment:  Hypothyroidism, follow-up visit    History of Present Illness:   Hypothyroidism was first diagnosed in 2005 She was about 2 months postpartum at that time and she believes that her thyroid was checked on a routine basis However she had been starting to have more fatigue especially in her legs even during her pregnancy Also she was having some hair loss and tendency to weight gain  She was started on levothyroxine by an endocrinologist and followed for some time on various doses About 8-9 years ago when she moved here she was complaining about having continued fatigue and some hair loss with Synthroid and the local endocrinologist changed her to [place] thyroid Records are not available from a previous history and labs; TPO antibody was increased at 844 She was followed with annual ultrasound exams also but the last 3 reports from 2012 until 2015 do not indicate any significant abnormality except irregular texture, minimal enlargement of the right lobe and no nodules She had been on the same dose of 120 mg [place] Thyroid for about 2-3 years  Recent history:  Her regimen was changed from [place] Thyroid 120 mg to taking levothyroxine and Cytomel separately because of high free T3 level of 6.9 in 8/17  Now taking 5 g of Cytomel in the morning along with levothyroxine 112 g  She has had some fatigue lately but she thinks this is from more problems with anemia related to menstrual blood loss No unusual court intolerance or hair loss  Has had some fluctuation in her weight, going up more since last visit  Has been very compliant with her medications before breakfast daily Although she was told to come in the late afternoon to have her lab work done she became a couple of hours after taking her morning medications Her free T3 level is again increased as before but her free  T4 is low normal and TSH high normal without any change She does not complain of any palpitations or shakiness         Patient's weight history is as follows:  Wt Readings from Last 3 Encounters:  07/18/17 188 lb (85.3 kg)  04/04/17 182 lb 12 oz (82.9 kg)  02/22/17 181 lb 4 oz (82.2 kg)    Thyroid function results have been as follows:  Lab Results  Component Value Date   FREET4 0.68 07/11/2017   FREET4 0.62 12/10/2016   FREET4 0.56 (L) 06/09/2016   TSH 3.94 07/11/2017   TSH 3.78 12/10/2016   TSH 3.15 06/09/2016   Lab Results  Component Value Date   T3FREE 4.9 (H) 07/11/2017   T3FREE 5.0 (H) 12/10/2016   T3FREE 6.9 (H) 06/09/2016     Past Medical History:  Diagnosis Date  . Anemia    Intercurrent iron deficiency anemia  . Hyperlipidemia   . Thyroid disease     Past Surgical History:  Procedure Laterality Date  . TUBAL LIGATION      Family History  Problem Relation Age of Onset  . Hypothyroidism Mother   . Diabetes Maternal Grandmother   . Cancer Maternal Grandfather        throat cancer  . Hypothyroidism Sister   . Hypothyroidism Son        Hyperthyroidism    Social History:  reports that she has never smoked. She has never  used smokeless tobacco. She reports that she drinks alcohol. She reports that she does not use drugs.  Allergies: No Known Allergies  Allergies as of 07/18/2017   No Known Allergies     Medication List       Accurate as of 07/18/17 10:52 AM. Always use your most recent med list.          ferrous sulfate 325 (65 FE) MG tablet Take 325 mg by mouth 2 (two) times daily with a meal.   levothyroxine 112 MCG tablet Commonly known as:  SYNTHROID, LEVOTHROID TAKE 1 TABLET BY MOUTH DAILY   liothyronine 5 MCG tablet Commonly known as:  CYTOMEL TAKE 1 TABLET BY MOUTH DAILY   POLY-IRON 150 150 MG capsule Generic drug:  iron polysaccharides TAKE ONE (1) CAPSULE BY MOUTH 2 TIMES DAILY        ROS     She has continued iron  orally and is Going to have surgical treatment for her fibroids          Examination:    BP 110/78   Pulse 77   Ht 5' 2.25" (1.581 m)   Wt 188 lb (85.3 kg)   SpO2 97%   BMI 34.11 kg/m   Thyroid not palpable Biceps reflexes appear normal  Assessment:  HYPOTHYROIDISM, 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, long-standing and with significant family history of autoimmune thyroid disease   She has had some fatigue but also significant issues with iron deficiency anemia  The patient had previously been on levothyroxine and more recently on low-dose Cytomel along with levothyroxine Not clear if subjectively she is feeling any different with reducing the total amount of T3 in her therapy regimen Currently her free T3 level is still high and TSH high normal  Discussed that this indicate that she needs to be on higher dose of levothyroxine and eliminate the Cytomel Locoid on exam   PLAN:  She was told to stop Cytomel She will start levothyroxine 137 g daily in the morning Does need follow-up in 3 months She will call if she has any unusual fatigue  Total visit time 15 minutes  Algis Lehenbauer 07/18/2017, 10:52 AM

## 2017-10-17 ENCOUNTER — Other Ambulatory Visit: Payer: BLUE CROSS/BLUE SHIELD

## 2017-10-24 ENCOUNTER — Encounter: Payer: Self-pay | Admitting: Endocrinology

## 2017-10-24 ENCOUNTER — Ambulatory Visit: Payer: BLUE CROSS/BLUE SHIELD | Admitting: Endocrinology

## 2017-10-24 VITALS — BP 116/72 | HR 99 | Resp 99 | Ht 62.0 in | Wt 193.0 lb

## 2017-10-24 DIAGNOSIS — E063 Autoimmune thyroiditis: Secondary | ICD-10-CM

## 2017-10-24 LAB — TSH: TSH: 5.76 u[IU]/mL — AB (ref 0.35–4.50)

## 2017-10-24 LAB — T3, FREE: T3 FREE: 5 pg/mL — AB (ref 2.3–4.2)

## 2017-10-24 LAB — T4, FREE: Free T4: 0.57 ng/dL — ABNORMAL LOW (ref 0.60–1.60)

## 2017-10-24 NOTE — Progress Notes (Signed)
Patient ID: Carrie May, female   DOB: November 17, 1965, 52 y.o.   MRN: [id number]            Reason for Appointment:  Hypothyroidism, follow-up visit    History of Present Illness:   Hypothyroidism was first diagnosed in 2005 She was about 2 months postpartum at that time and she believes that her thyroid was checked on a routine basis However she had been starting to have more fatigue especially in her legs even during her pregnancy Also she was having some hair loss and tendency to weight gain  She was started on levothyroxine by an endocrinologist and followed for some time on various doses About 8-9 years ago when she moved here she was complaining about having continued fatigue and some hair loss with Synthroid and the local endocrinologist changed her to [place] thyroid Records are not available from a previous history and labs; TPO antibody was increased at 844 She was followed with annual ultrasound exams also but the last 3 reports from 2012 until 2015 do not indicate any significant abnormality except irregular texture, minimal enlargement of the right lobe and no nodules She had been on the same dose of 120 mg [place] Thyroid for about 2-3 years  Recent history:  Her regimen was changed from [place] Thyroid 120 mg to taking levothyroxine and Cytomel separately because of high free T3 level of 6.9 in 8/17   On her last visit she was taking 5 g of Cytomel in the morning along with 112 g levothyroxine Because of her high free T3 level she was told to stop Cytomel and increase the levothyroxine up to 137 g  Has been very compliant with her medications before breakfast daily although did not take her medications as morning  She however says that after her last visit in October 18 she was feeling more tired and she went back to Cytomel on her own along with the higher dose of levothyroxine She does not think she has any unusual shakiness or palpitations She does feel somewhat tired  because of her anemia and relatively cold  She has not had her labs done         Patient's weight history is as follows:  Wt Readings from Last 3 Encounters:  10/24/17 193 lb (87.5 kg)  07/18/17 188 lb (85.3 kg)  04/04/17 182 lb 12 oz (82.9 kg)    Thyroid function results have been as follows:  Lab Results  Component Value Date   FREET4 0.68 07/11/2017   FREET4 0.62 12/10/2016   FREET4 0.56 (L) 06/09/2016   TSH 3.94 07/11/2017   TSH 3.78 12/10/2016   TSH 3.15 06/09/2016   Lab Results  Component Value Date   T3FREE 4.9 (H) 07/11/2017   T3FREE 5.0 (H) 12/10/2016   T3FREE 6.9 (H) 06/09/2016     Past Medical History:  Diagnosis Date  . Anemia    Intercurrent iron deficiency anemia  . Hyperlipidemia   . Thyroid disease     Past Surgical History:  Procedure Laterality Date  . TUBAL LIGATION      Family History  Problem Relation Age of Onset  . Hypothyroidism Mother   . Diabetes Maternal Grandmother   . Cancer Maternal Grandfather        throat cancer  . Hypothyroidism Sister   . Hypothyroidism Son        Hyperthyroidism    Social History:  reports that  has never smoked. she has never used smokeless tobacco. She reports  that she drinks alcohol. She reports that she does not use drugs.  Allergies: No Known Allergies  Allergies as of 10/24/2017   No Known Allergies     Medication List        Accurate as of 10/24/17 10:36 AM. Always use your most recent med list.          ferrous sulfate 325 (65 FE) MG tablet Take 325 mg by mouth 2 (two) times daily with a meal.   levothyroxine 137 MCG tablet Commonly known as:  SYNTHROID Take 1 tablet (137 mcg total) by mouth daily before breakfast.   liothyronine 25 MCG tablet Commonly known as:  CYTOMEL Take by mouth daily.   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, long-standing and with significant family history of autoimmune thyroid disease   Patient has been on Cytomel 5 g along with levothyroxine 137 However she went back on Cytomel despite instructions on the last visit to stop this because of high T3 level She thinks she feels better with her energy level with the combination  Although she is not overtly hyperthyroid her reflexes are brisk today   PLAN:  She does have labs done and decide on treatment Most likely unless T3 levels are still high normal she can try half tablet of the Cytomel along with appropriate doses of levothyroxine Will need follow-up short-term also Advised her not to change her medications on her own  She will follow-up with her gynecologist regarding her anemia  Total visit time = 15 minutes  Carrie May 10/24/2017, 10:36 AM

## 2017-11-04 ENCOUNTER — Telehealth: Payer: Self-pay | Admitting: Endocrinology

## 2017-11-04 NOTE — Telephone Encounter (Signed)
Carrie May Self [phone]  [name] said she was returning your call about her thyroid medicine

## 2017-11-10 ENCOUNTER — Telehealth: Payer: Self-pay | Admitting: Endocrinology

## 2017-11-10 NOTE — Telephone Encounter (Signed)
Pt is calling back, she states she received a call from us last Friday, she called back, and stated we sent a message back that someone would call her back  She states no one has returned the call.   Please advise

## 2017-11-14 NOTE — Telephone Encounter (Signed)
Patient stated she missed a call from lisa.

## 2017-11-15 NOTE — Telephone Encounter (Signed)
Pt is returning your call

## 2017-11-16 NOTE — Telephone Encounter (Signed)
Called but patient does not have vm set up

## 2017-11-17 NOTE — Telephone Encounter (Signed)
Patient ask Carrie May to call her, she is calling about changes in her medication, she don't understand. Please advise

## 2017-11-18 NOTE — Telephone Encounter (Signed)
Returned call but no vm set up

## 2017-11-23 ENCOUNTER — Other Ambulatory Visit: Payer: Self-pay

## 2017-11-23 MED ORDER — LEVOTHYROXINE SODIUM 25 MCG PO TABS: 25.0000 ug | ORAL_TABLET | Freq: Every day | ORAL | 2 refills | Status: DC

## 2017-12-01 NOTE — Telephone Encounter (Signed)
Were you able to contact this patient? 

## 2017-12-01 NOTE — Telephone Encounter (Signed)
Called again but still no vm set up

## 2018-01-03 ENCOUNTER — Other Ambulatory Visit: Payer: Self-pay | Admitting: Endocrinology

## 2018-02-17 ENCOUNTER — Other Ambulatory Visit: Payer: BLUE CROSS/BLUE SHIELD

## 2018-02-21 ENCOUNTER — Ambulatory Visit: Payer: BLUE CROSS/BLUE SHIELD | Admitting: Endocrinology

## 2018-04-12 ENCOUNTER — Other Ambulatory Visit: Payer: Self-pay | Admitting: Endocrinology

## 2018-04-14 ENCOUNTER — Other Ambulatory Visit (INDEPENDENT_AMBULATORY_CARE_PROVIDER_SITE_OTHER): Payer: BLUE CROSS/BLUE SHIELD

## 2018-04-14 DIAGNOSIS — E063 Autoimmune thyroiditis: Secondary | ICD-10-CM | POA: Diagnosis not present

## 2018-04-14 LAB — T3, FREE: T3 FREE: 5.5 pg/mL — AB (ref 2.3–4.2)

## 2018-04-14 LAB — TSH: TSH: 10.34 u[IU]/mL — ABNORMAL HIGH (ref 0.35–4.50)

## 2018-04-14 LAB — T4, FREE: Free T4: 0.59 ng/dL — ABNORMAL LOW (ref 0.60–1.60)

## 2018-04-18 ENCOUNTER — Encounter: Payer: Self-pay | Admitting: Endocrinology

## 2018-04-18 ENCOUNTER — Ambulatory Visit: Payer: BLUE CROSS/BLUE SHIELD | Admitting: Endocrinology

## 2018-04-18 VITALS — BP 120/80 | HR 70 | Ht 62.0 in | Wt 183.2 lb

## 2018-04-18 DIAGNOSIS — E063 Autoimmune thyroiditis: Secondary | ICD-10-CM | POA: Diagnosis not present

## 2018-04-18 MED ORDER — LEVOTHYROXINE SODIUM 125 MCG PO TABS: 125.0000 ug | ORAL_TABLET | Freq: Every day | ORAL | 3 refills | Status: DC

## 2018-04-18 NOTE — Progress Notes (Signed)
Patient ID: Carrie May, female   DOB: Jan 11, 1966, 52 y.o.   MRN: [id number]            Reason for Appointment:  Hypothyroidism, follow-up visit    History of Present Illness:   Hypothyroidism was first diagnosed in 2005 She was about 2 months postpartum at that time and she believes that her thyroid was checked on a routine basis However she had been starting to have more fatigue especially in her legs even during her pregnancy Also she was having some hair loss and tendency to weight gain  She was started on levothyroxine by an endocrinologist and followed for some time on various doses About 8-9 years ago when she moved here she was complaining about having continued fatigue and some hair loss with Synthroid and the local endocrinologist changed her to [facility] thyroid Records are not available from a previous history and labs; TPO antibody was increased at 844 She was followed with annual ultrasound exams also but the last 3 reports from 2012 until 2015 do not indicate any significant abnormality except irregular texture, minimal enlargement of the right lobe and no nodules She had been on the same dose of 120 mg [facility] Thyroid for about 2-3 years  Recent history:  Her regimen was changed from [facility] Thyroid 120 mg to taking levothyroxine and Cytomel separately because of high free T3 level of 6.9 in 8/17 Initially she was taking 5 g of Cytomel in the morning along with 112 g levothyroxine Because of her high free T3 level she was told to stop Cytomel and increase the levothyroxine up to 137 g She however continued taking Cytomel on her own  On her last visit because of her high T3 level she was told to take only half a tablet of the Cytomel and her levothyroxine was supposed to be increased by 25 mcg Apparently never she got a prescription of 25 mcg of liothyronine and her pharmacy told her not to take levothyroxine even though this was not recommended  Was taking 25 mcg of  liothyronine alone she does not think she had any unusual fatigue but also her problems with uterine fibroids and iron deficiency anemia has improved She does not feel shaky or have any palpitations or sweating Her TSH is now higher at 10.3 along with relatively low free T4 as expected but her T3 level has gone up to 5.5  Patient's weight history is as follows:  Wt Readings from Last 3 Encounters:  04/18/18 183 lb 3.2 oz (83.1 kg)  10/24/17 193 lb (87.5 kg)  07/18/17 188 lb (85.3 kg)    Thyroid function results have been as follows:  Lab Results  Component Value Date   FREET4 0.59 (L) 04/14/2018   FREET4 0.57 (L) 10/24/2017   FREET4 0.68 07/11/2017   TSH 10.34 (H) 04/14/2018   TSH 5.76 (H) 10/24/2017   TSH 3.94 07/11/2017   Lab Results  Component Value Date   T3FREE 5.5 (H) 04/14/2018   T3FREE 5.0 (H) 10/24/2017   T3FREE 4.9 (H) 07/11/2017   T3FREE 5.0 (H) 12/10/2016     Past Medical History:  Diagnosis Date  . Anemia    Intercurrent iron deficiency anemia  . Hyperlipidemia   . Thyroid disease     Past Surgical History:  Procedure Laterality Date  . TUBAL LIGATION      Family History  Problem Relation Age of Onset  . Hypothyroidism Mother   . Diabetes Maternal Grandmother   . Cancer Maternal  Grandfather        throat cancer  . Hypothyroidism Sister   . Hypothyroidism Son        Hyperthyroidism    Social History:  reports that she has never smoked. She has never used smokeless tobacco. She reports that she drinks alcohol. She reports that she does not use drugs.  Allergies: No Known Allergies  Allergies as of 04/18/2018   No Known Allergies     Medication List        Accurate as of 04/18/18 10:19 AM. Always use your most recent med list.          liothyronine 25 MCG tablet Commonly known as:  CYTOMEL Take by mouth daily.   liothyronine 5 MCG tablet Commonly known as:  CYTOMEL TAKE 1 TABLET BY MOUTH DAILY   POLY-IRON 150 150 MG  capsule Generic drug:  iron polysaccharides TAKE ONE (1) CAPSULE BY MOUTH 2 TIMES DAILY        ROS     She has taken iron only occasionally and does not appear to have any further menstrual blood loss           Examination:    BP 120/80 (BP Location: Left Arm, Patient Position: Sitting, Cuff Size: Normal)   Pulse 70   Ht 5\' 2"  (1.575 m)   Wt 183 lb 3.2 oz (83.1 kg)   SpO2 97%   BMI 33.51 kg/m   Thyroid not palpable Biceps reflexes are slightly brisk No tremor   Assessment:  HYPOTHYROIDISM, 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, long-standing and with significant family history of autoimmune thyroid disease   Patient has been on Cytomel 25 g even though she was supposed to take only 2.5 along with levothyroxine Also she has not followed up as directed after her dosage change in January Surprisingly even with TSH of 10 she does not complain of feeling tired  She appears to have consistently high T3 levels even when taking 5 mcg of liothyronine and may do better with levothyroxine alone Most likely her previous symptoms of fatigue were related to iron deficiency anemia or other than hypothyroidism   PLAN:  Discussed her treatment history and labs and plan of treatment Discussed that since she tends to have high T3 levels with liothyronine she should not take this for now She will try empirically 125 mcg of levothyroxine alone since currently her equivalent dose of liothyronine is about 100 mcg levothyroxine She does need to follow-up regularly and will be scheduled for 6 weeks  Total visit time = 15 minutes  Talia Hoheisel 04/18/2018, 10:19 AM

## 2018-06-05 ENCOUNTER — Other Ambulatory Visit (INDEPENDENT_AMBULATORY_CARE_PROVIDER_SITE_OTHER): Payer: BLUE CROSS/BLUE SHIELD

## 2018-06-05 DIAGNOSIS — E063 Autoimmune thyroiditis: Secondary | ICD-10-CM | POA: Diagnosis not present

## 2018-06-05 LAB — TSH: TSH: 2.87 u[IU]/mL (ref 0.35–4.50)

## 2018-06-05 LAB — T4, FREE: Free T4: 0.65 ng/dL (ref 0.60–1.60)

## 2018-06-05 LAB — T3, FREE: T3, Free: 4.9 pg/mL — ABNORMAL HIGH (ref 2.3–4.2)

## 2018-06-08 ENCOUNTER — Ambulatory Visit: Payer: BLUE CROSS/BLUE SHIELD | Admitting: Endocrinology

## 2018-07-19 ENCOUNTER — Ambulatory Visit: Payer: BLUE CROSS/BLUE SHIELD | Admitting: Endocrinology

## 2018-07-19 DIAGNOSIS — Z0289 Encounter for other administrative examinations: Secondary | ICD-10-CM

## 2018-09-26 ENCOUNTER — Encounter: Payer: Self-pay | Admitting: Endocrinology

## 2018-09-26 ENCOUNTER — Ambulatory Visit: Payer: BLUE CROSS/BLUE SHIELD | Admitting: Endocrinology

## 2018-09-26 VITALS — BP 132/80 | HR 69 | Ht 62.0 in | Wt 186.6 lb

## 2018-09-26 DIAGNOSIS — E063 Autoimmune thyroiditis: Secondary | ICD-10-CM | POA: Diagnosis not present

## 2018-09-26 LAB — T3, FREE: T3, Free: 4.1 pg/mL (ref 2.3–4.2)

## 2018-09-26 LAB — T4, FREE: FREE T4: 0.5 ng/dL — AB (ref 0.60–1.60)

## 2018-09-26 LAB — TSH: TSH: 9.61 u[IU]/mL — ABNORMAL HIGH (ref 0.35–4.50)

## 2018-09-26 MED ORDER — LEVOTHYROXINE SODIUM 150 MCG PO TABS: 150.0000 ug | ORAL_TABLET | Freq: Every day | ORAL | 1 refills | Status: DC

## 2018-09-26 NOTE — Progress Notes (Signed)
Patient ID: Carrie May, female   DOB: [birth year], 52 y.o.   MRN: [id number]            Reason for Appointment:  Hypothyroidism, follow-up visit    History of Present Illness:   Hypothyroidism was first diagnosed in 2005 She was about 2 months postpartum at that time and she believes that her thyroid was checked on a routine basis However she had been starting to have more fatigue especially in her legs even during her pregnancy Also she was having some hair loss and tendency to weight gain  She was started on levothyroxine by an endocrinologist and followed for some time on various doses About 8-9 years ago when she moved here she was complaining about having continued fatigue and some hair loss with Synthroid and the local endocrinologist changed her to [facility] thyroid Records are not available from a previous history and labs; TPO antibody was increased at 844 She was followed with annual ultrasound exams also but the last 3 reports from 2012 until 2015 do not indicate any significant abnormality except irregular texture, minimal enlargement of the right lobe and no nodules She had been on the same dose of 120 mg [facility] Thyroid for about 2-3 years  Recent history:  Her regimen was changed from [facility] Thyroid 120 mg to levothyroxine and Cytomel separately because of high free T3 level of 6.9 in 8/17 Initially she was taking 5 g of Cytomel in the morning along with 112 g levothyroxine Because of her high free T3 level she was told to stop Cytomel and increase the levothyroxine up to 137 g She however continued taking Cytomel on her own  Previously because of her high T3 level she was told to take only half a tablet of the Cytomel and her levothyroxine was supposed to be increased by 25 mcg; however she never she got a prescription of 25 mcg of liothyronine and her pharmacy told her not to take levothyroxine even though this was not recommended  Was taking 25 mcg of liothyronine  alone her TSH was high and T3 was high at 5.5  She is now taking levothyroxine alone, 125 mcg daily She did not come back for follow-up as directed to couple of months later  She feels fairly good with her energy level and not any different than before However she is concerned about hair loss since about July with some thinning of the hair but mostly in the frontal area No cold intolerance Labs not available  Patient's weight history is as follows:  Wt Readings from Last 3 Encounters:  09/26/18 186 lb 9.6 oz (84.6 kg)  04/18/18 183 lb 3.2 oz (83.1 kg)  10/24/17 193 lb (87.5 kg)    Thyroid function results have been as follows:  Lab Results  Component Value Date   FREET4 0.65 06/05/2018   FREET4 0.59 (L) 04/14/2018   FREET4 0.57 (L) 10/24/2017   TSH 2.87 06/05/2018   TSH 10.34 (H) 04/14/2018   TSH 5.76 (H) 10/24/2017   Lab Results  Component Value Date   T3FREE 4.9 (H) 06/05/2018   T3FREE 5.5 (H) 04/14/2018   T3FREE 5.0 (H) 10/24/2017   T3FREE 4.9 (H) 07/11/2017     Past Medical History:  Diagnosis Date  . Anemia    Intercurrent iron deficiency anemia  . Hyperlipidemia   . Thyroid disease     Past Surgical History:  Procedure Laterality Date  . TUBAL LIGATION      Family History  Problem  Relation Age of Onset  . Hypothyroidism Mother   . Diabetes Maternal Grandmother   . Cancer Maternal Grandfather        throat cancer  . Hypothyroidism Sister   . Hypothyroidism Son        Hyperthyroidism    Social History:  reports that she has never smoked. She has never used smokeless tobacco. She reports that she drinks alcohol. She reports that she does not use drugs.  Allergies: No Known Allergies  Allergies as of 09/26/2018   No Known Allergies     Medication List        Accurate as of 09/26/18  8:59 AM. Always use your most recent med list.          levothyroxine 125 MCG tablet Commonly known as:  SYNTHROID, LEVOTHROID Take 1 tablet (125 mcg  total) by mouth daily.   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, long-standing and with significant family history of autoimmune thyroid disease   She usually has no significant symptoms even when her TSH is high Also has not been on any consistent regimen Generally with any dose of liothyronine even 5 mcg she tends to have high T3 levels  Currently with 125 mcg levothyroxine she is subjectively doing well but complaining of hair loss She did have a normal TSH and follow-up in August on this regimen  Not clear if her hair loss is related to hypothyroidism 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  Will increase her levothyroxine dosage to 150 mcg daily and follow-up in 2 months  Chad Donoghue Lucianne Muss

## 2018-12-22 ENCOUNTER — Other Ambulatory Visit: Payer: Self-pay | Admitting: Endocrinology

## 2018-12-28 ENCOUNTER — Encounter: Payer: Self-pay | Admitting: Endocrinology

## 2018-12-28 ENCOUNTER — Ambulatory Visit (INDEPENDENT_AMBULATORY_CARE_PROVIDER_SITE_OTHER): Payer: BLUE CROSS/BLUE SHIELD | Admitting: Endocrinology

## 2018-12-28 ENCOUNTER — Other Ambulatory Visit: Payer: Self-pay

## 2018-12-28 VITALS — BP 124/86 | HR 66 | Ht 62.0 in | Wt 175.0 lb

## 2018-12-28 DIAGNOSIS — E063 Autoimmune thyroiditis: Secondary | ICD-10-CM | POA: Diagnosis not present

## 2018-12-28 LAB — T4, FREE: Free T4: 0.77 ng/dL (ref 0.60–1.60)

## 2018-12-28 LAB — TSH: TSH: 6.75 u[IU]/mL — ABNORMAL HIGH (ref 0.35–4.50)

## 2018-12-28 NOTE — Progress Notes (Signed)
Patient ID: Carrie May, female   DOB: 1966/03/17, 53 y.o.   MRN: [id number]            Reason for Appointment:  Hypothyroidism, follow-up visit    History of Present Illness:   Hypothyroidism was first diagnosed in 2005 She was about 2 months postpartum at that time and she believes that her thyroid was checked on a routine basis However she had been starting to have more fatigue especially in her legs even during her pregnancy Also she was having some hair loss and tendency to weight gain  She was started on levothyroxine by an endocrinologist and followed for some time on various doses About 8-9 years ago when she moved here she was complaining about having continued fatigue and some hair loss with Synthroid and the local endocrinologist changed her to [facility] thyroid Records are not available from a previous history and labs; TPO antibody was increased at 844 She was followed with annual ultrasound exams also but the last 3 reports from 2012 until 2015 do not indicate any significant abnormality except irregular texture, minimal enlargement of the right lobe and no nodules She had been on the same dose of 120 mg [facility] Thyroid for about 2-3 years  Recent history:  She was changed from [facility] Thyroid 120 mg to levothyroxine and Cytomel separately because of high free T3 level of 6.9 in 8/17 Initially she was taking 5 g of Cytomel in the morning along with 112 g levothyroxine Because of her high free T3 level she was told to stop Cytomel and increase the levothyroxine up to 137 g  She is now taking levothyroxine alone, 150 mcg daily Her dose has been gradually increased because of periodic increases in her TSH level  After her last visit in December with her dosage increase she felt a little less tired She thinks she is somewhat tired now because of long working hours Also had chronic cold intolerance and hair loss which is not any different than usual  She has a routine of  taking her tablet very consistently before breakfast in the mornings and takes her iron tablet later in the day  Labs not available  Patient's weight history is as follows:  Wt Readings from Last 3 Encounters:  12/28/18 175 lb (79.4 kg)  09/26/18 186 lb 9.6 oz (84.6 kg)  04/18/18 183 lb 3.2 oz (83.1 kg)    Thyroid function results have been as follows:  Lab Results  Component Value Date   FREET4 0.77 12/28/2018   FREET4 0.50 (L) 09/26/2018   FREET4 0.65 06/05/2018   TSH 6.75 (H) 12/28/2018   TSH 9.61 (H) 09/26/2018   TSH 2.87 06/05/2018   Lab Results  Component Value Date   T3FREE 4.1 09/26/2018   T3FREE 4.9 (H) 06/05/2018   T3FREE 5.5 (H) 04/14/2018   T3FREE 5.0 (H) 10/24/2017     Past Medical History:  Diagnosis Date  . Anemia    Intercurrent iron deficiency anemia  . Hyperlipidemia   . Thyroid disease     Past Surgical History:  Procedure Laterality Date  . TUBAL LIGATION      Family History  Problem Relation Age of Onset  . Hypothyroidism Mother   . Diabetes Maternal Grandmother   . Cancer Maternal Grandfather        throat cancer  . Hypothyroidism Sister   . Hypothyroidism Son        Hyperthyroidism    Social History:  reports that she has never  smoked. She has never used smokeless tobacco. She reports current alcohol use. She reports that she does not use drugs.  Allergies: No Known Allergies  Allergies as of 12/28/2018   No Known Allergies     Medication List       Accurate as of December 28, 2018  1:00 PM. Always use your most recent med list.        levothyroxine 150 MCG tablet Commonly known as:  SYNTHROID, LEVOTHROID Take 1 tablet (150 mcg total) by mouth daily.        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, long-standing and with significant family history of autoimmune thyroid disease   She is now on 150 mcg levothyroxine She is very compliant with this She may have some nonspecific symptoms when her thyroid level is not normal and it feels less tired after her last dose was increased  Her exam is unremarkable  Weight loss: She thinks this is stress  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

## 2018-12-29 LAB — T3: T3, Total: 86 ng/dL (ref 71–180)

## 2019-01-25 ENCOUNTER — Other Ambulatory Visit: Payer: Self-pay | Admitting: Endocrinology

## 2019-03-27 ENCOUNTER — Other Ambulatory Visit: Payer: Self-pay | Admitting: Endocrinology

## 2019-04-29 ENCOUNTER — Other Ambulatory Visit: Payer: Self-pay | Admitting: Endocrinology

## 2019-04-29 DIAGNOSIS — E063 Autoimmune thyroiditis: Secondary | ICD-10-CM

## 2019-05-01 ENCOUNTER — Other Ambulatory Visit (INDEPENDENT_AMBULATORY_CARE_PROVIDER_SITE_OTHER): Payer: BC Managed Care – PPO

## 2019-05-01 ENCOUNTER — Other Ambulatory Visit: Payer: Self-pay

## 2019-05-01 DIAGNOSIS — E063 Autoimmune thyroiditis: Secondary | ICD-10-CM

## 2019-05-01 LAB — TSH: TSH: 0.16 u[IU]/mL — ABNORMAL LOW (ref 0.35–4.50)

## 2019-05-01 LAB — T4, FREE: Free T4: 0.95 ng/dL (ref 0.60–1.60)

## 2019-05-02 NOTE — Progress Notes (Signed)
Patient ID: Carrie May, female   DOB: 1966/08/23, 53 y.o.   MRN: [id number]            Reason for Appointment:  Hypothyroidism, follow-up visit  Today's office visit was provided via telemedicine using video technique The patient was explained the limitations of evaluation and management by telemedicine and the availability of in person appointments.  The patient understood the limitations and agreed to proceed. Patient also understood that the telehealth visit is billable. . Location of the patient: Patient's home . Location of the provider: Physician office Only the patient and myself were participating in the encounter     History of Present Illness:   Hypothyroidism was first diagnosed in 2005 She was about 2 months postpartum at that time and she believes that her thyroid was checked on a routine basis However she had been starting to have more fatigue especially in her legs even during her pregnancy Also she was having some hair loss and tendency to weight gain  She was started on levothyroxine by an endocrinologist and followed for some time on various doses About 8-9 years ago when she moved here she was complaining about having continued fatigue and some hair loss with Synthroid and the local endocrinologist changed her to [facility] thyroid Records are not available from a previous history and labs; TPO antibody was increased at 844 She was followed with annual ultrasound exams also but the last 3 reports from 2012 until 2015 do not indicate any significant abnormality except irregular texture, minimal enlargement of the right lobe and no nodules She had been on the same dose of 120 mg [facility] Thyroid for about 2-3 years  Recent history:  She was changed from [facility] Thyroid 120 mg to levothyroxine and Cytomel separately because of high free T3 level of 6.9 in 8/17 Initially she was taking 5 g of Cytomel in the morning along with 112 g levothyroxine Because of her high  free T3 level she was told to stop Cytomel and increase the levothyroxine up to 137 g  She is continuing to be taking levothyroxine alone, 150 mcg daily  She was last seen in 3/20 At that time she was having some fatigue which she thought was from long working hours Her TSH was 6.75 despite her saying that she was very consistent with her thyroid supplement  However she forgot the instructions even though she was sent a letter with the new regimen  Currently with her dose of 150 mcg daily she feels fairly good time does not feel unusually tired Has had chronic cold intolerance and hair loss which is not any different than usual  She has a routine of taking her tablet very consistently before breakfast in the mornings as before Not on iron currently  Her labs now show her TSH to be 0.16 with normal T4  Patient's weight history is as follows:  Reported weight recently: 179  Wt Readings from Last 3 Encounters:  12/28/18 175 lb (79.4 kg)  09/26/18 186 lb 9.6 oz (84.6 kg)  04/18/18 183 lb 3.2 oz (83.1 kg)    Thyroid function results have been as follows:  Lab Results  Component Value Date   FREET4 0.95 05/01/2019   FREET4 0.77 12/28/2018   FREET4 0.50 (L) 09/26/2018   TSH 0.16 (L) 05/01/2019   TSH 6.75 (H) 12/28/2018   TSH 9.61 (H) 09/26/2018   Lab Results  Component Value Date   T3FREE 4.1 09/26/2018   T3FREE 4.9 (H) 06/05/2018  T3FREE 5.5 (H) 04/14/2018   T3FREE 5.0 (H) 10/24/2017     Past Medical History:  Diagnosis Date  . Anemia    Intercurrent iron deficiency anemia  . Hyperlipidemia   . Thyroid disease     Past Surgical History:  Procedure Laterality Date  . TUBAL LIGATION      Family History  Problem Relation Age of Onset  . Hypothyroidism Mother   . Diabetes Maternal Grandmother   . Cancer Maternal Grandfather        throat cancer  . Hypothyroidism Sister   . Hypothyroidism Son        Hyperthyroidism    Social History:  reports that she  has never smoked. She has never used smokeless tobacco. She reports current alcohol use. She reports that she does not use drugs.  Allergies: No Known Allergies  Allergies as of 05/03/2019   No Known Allergies     Medication List       Accurate as of May 02, 2019  9:49 PM. If you have any questions, ask your nurse or doctor.        levothyroxine 150 MCG tablet Commonly known as: SYNTHROID TAKE 1 TABLET BY MOUTH EVERY DAY        ROS     She has taken iron regularly OTC, followed by other physicians  She does have an IUD which has regulated her periods  She is asking about a little swelling on her cheeks especially left without pain         Examination:    There were no vitals taken for this visit.  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

## 2019-05-03 ENCOUNTER — Ambulatory Visit (INDEPENDENT_AMBULATORY_CARE_PROVIDER_SITE_OTHER): Payer: BC Managed Care – PPO | Admitting: Endocrinology

## 2019-05-03 ENCOUNTER — Other Ambulatory Visit: Payer: Self-pay

## 2019-05-03 ENCOUNTER — Encounter: Payer: Self-pay | Admitting: Endocrinology

## 2019-05-03 DIAGNOSIS — E063 Autoimmune thyroiditis: Secondary | ICD-10-CM | POA: Diagnosis not present

## 2019-05-03 DIAGNOSIS — L658 Other specified nonscarring hair loss: Secondary | ICD-10-CM | POA: Diagnosis not present

## 2019-06-02 ENCOUNTER — Other Ambulatory Visit: Payer: Self-pay | Admitting: Endocrinology

## 2019-07-09 ENCOUNTER — Other Ambulatory Visit: Payer: Self-pay

## 2019-07-09 ENCOUNTER — Other Ambulatory Visit (INDEPENDENT_AMBULATORY_CARE_PROVIDER_SITE_OTHER): Payer: BC Managed Care – PPO

## 2019-07-09 DIAGNOSIS — E063 Autoimmune thyroiditis: Secondary | ICD-10-CM

## 2019-07-09 LAB — TSH: TSH: 3.85 u[IU]/mL (ref 0.35–4.50)

## 2019-07-09 LAB — T4, FREE: Free T4: 0.69 ng/dL (ref 0.60–1.60)

## 2019-07-11 ENCOUNTER — Ambulatory Visit: Payer: BC Managed Care – PPO | Admitting: Endocrinology

## 2019-07-25 ENCOUNTER — Encounter: Payer: Self-pay | Admitting: Endocrinology

## 2019-07-25 ENCOUNTER — Other Ambulatory Visit: Payer: Self-pay

## 2019-07-25 ENCOUNTER — Ambulatory Visit: Payer: BC Managed Care – PPO | Admitting: Endocrinology

## 2019-07-25 VITALS — BP 130/70 | HR 65 | Ht 62.0 in | Wt 182.2 lb

## 2019-07-25 DIAGNOSIS — E063 Autoimmune thyroiditis: Secondary | ICD-10-CM | POA: Diagnosis not present

## 2019-07-25 NOTE — Progress Notes (Signed)
Patient ID: Carrie May, female   DOB: 1966-03-28, 53 y.o.   MRN: [id number]            Reason for Appointment:  Hypothyroidism, follow-up visit      History of Present Illness:   Hypothyroidism was first diagnosed in 2005 She was about 2 months postpartum at that time and she believes that her thyroid was checked on a routine basis However she had been starting to have more fatigue especially in her legs even during her pregnancy Also she was having some hair loss and tendency to weight gain  She was started on levothyroxine by an endocrinologist and followed for some time on various doses About 8-9 years ago when she moved here she was complaining about having continued fatigue and some hair loss with Synthroid and the local endocrinologist changed her to Armour thyroid Records are not available from a previous history and labs; TPO antibody was increased at 844 She was followed with annual ultrasound exams also but the last 3 reports from 2012 until 2015 do not indicate any significant abnormality except irregular texture, minimal enlargement of the right lobe and no nodules She had been on the same dose of 120 mg Armour Thyroid for about 2-3 years  Recent history:  She was changed from Armour Thyroid 120 mg to levothyroxine and Cytomel separately because of high free T3 level of 6.9 in 8/17 Initially she was taking 5 g of Cytomel in the morning along with 112 g levothyroxine Because of her high free T3 level she was told to stop Cytomel and increase the levothyroxine up to 137 g  She is continuing to be taking levothyroxine alone, 150 mcg daily  Her TSH was 6.75 in 3/20 despite no change in her compliance However without changing her dose her TSH was low in 7/20  Now she is taking her generic levothyroxine 150 mcg 6-1/2 pills a week Again she thinks she has been very consistent with her thyroid supplement and has not changed how she takes it  She feels fairly good and  currently with her not being at work she is not stressed Has had chronic cold intolerance and hair loss, not any worse  She has a good routine of taking her tablet very consistently before breakfast in the mornings She is not aware of any change in her generic levothyroxine manufacturer recently, still gets it from [facility] Not on iron supplements  Her labs now show her TSH to be back to normal at 3.8 Does have more variable free T4 levels  Patient's weight history is as follows:  Reported weight recently: 179  Wt Readings from Last 3 Encounters:  07/25/19 182 lb 3.2 oz (82.6 kg)  12/28/18 175 lb (79.4 kg)  09/26/18 186 lb 9.6 oz (84.6 kg)    Thyroid function results have been as follows:  Lab Results  Component Value Date   FREET4 0.69 07/09/2019   FREET4 0.95 05/01/2019   FREET4 0.77 12/28/2018   TSH 3.85 07/09/2019   TSH 0.16 (L) 05/01/2019   TSH 6.75 (H) 12/28/2018   Lab Results  Component Value Date   T3FREE 4.1 09/26/2018   T3FREE 4.9 (H) 06/05/2018   T3FREE 5.5 (H) 04/14/2018   T3FREE 5.0 (H) 10/24/2017     Past Medical History:  Diagnosis Date  . Anemia    Intercurrent iron deficiency anemia  . Hyperlipidemia   . Thyroid disease     Past Surgical History:  Procedure Laterality Date  . TUBAL  LIGATION      Family History  Problem Relation Age of Onset  . Hypothyroidism Mother   . Diabetes Maternal Grandmother   . Cancer Maternal Grandfather        throat cancer  . Hypothyroidism Sister   . Hypothyroidism Son        Hyperthyroidism    Social History:  reports that she has never smoked. She has never used smokeless tobacco. She reports current alcohol use. She reports that she does not use drugs.  Allergies: No Known Allergies  Allergies as of 07/25/2019   No Known Allergies     Medication List       Accurate as of July 25, 2019  2:14 PM. If you have any questions, ask your nurse or doctor.        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, long-standing and with significant family history of autoimmune thyroid disease   She is now on 150 mcg levothyroxine 6-1/2 tablets a week, previously taking once daily  Although previously has had inconsistent TSH levels with the same dosage it is now normal with taking half tablet less per week since her last visit She is not having any fatigue even with her TSH of 3.85 She takes her levothyroxine at least an hour before breakfast and without any other supplement   Chronic hair loss: Mild and she is not concerned about it, not any worse, 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

## 2019-08-10 ENCOUNTER — Other Ambulatory Visit: Payer: Self-pay | Admitting: Endocrinology

## 2019-08-30 ENCOUNTER — Encounter (HOSPITAL_COMMUNITY): Payer: Self-pay | Admitting: Emergency Medicine

## 2019-08-30 ENCOUNTER — Ambulatory Visit (HOSPITAL_COMMUNITY)
Admission: EM | Admit: 2019-08-30 | Discharge: 2019-08-30 | Disposition: A | Discharge status: Home / Self Care | Payer: BC Managed Care – PPO | Attending: Urgent Care | Admitting: Urgent Care

## 2019-08-30 ENCOUNTER — Other Ambulatory Visit: Payer: Self-pay

## 2019-08-30 DIAGNOSIS — M5412 Radiculopathy, cervical region: Secondary | ICD-10-CM

## 2019-08-30 DIAGNOSIS — E039 Hypothyroidism, unspecified: Secondary | ICD-10-CM

## 2019-08-30 DIAGNOSIS — M79602 Pain in left arm: Secondary | ICD-10-CM

## 2019-08-30 MED ORDER — CYCLOBENZAPRINE HCL 5 MG PO TABS: 5.0000 mg | ORAL_TABLET | Freq: Every evening | ORAL | 0 refills | Status: DC | PRN

## 2019-08-30 MED ORDER — PREDNISONE 20 MG PO TABS: ORAL_TABLET | ORAL | 0 refills | Status: DC

## 2019-08-30 NOTE — ED Provider Notes (Signed)
[facility]   MRN: [id number] DOB: Oct 15, 1966  Subjective:   Carrie May is a 53 y.o. female presenting for 50-month history of progressively worsening left arm pain.  Patient describes that pain is shooting from upper part of her arm/shoulder and shoots down into her forearm and sometimes all the way down to her hand.  Symptoms are worse at night and lately has had a difficult time sleeping because symptoms are elicited from bending the arm.  She is also had some tingling of her hand.  Denies history of neck injury, neck pain.  Denies bruising, trauma, falls.  Patient does have 2 knots in her anterior deltoid and tricep.  Has a history of anemia and hypothyroidism.  She is only take medications for her thyroid.  She does have a PCP.  Patient is not a smoker, does not have diabetes and has never needed to take high blood pressure medication.  Patient has low risk factors for ACS, timing also does not fit as she has had this symptom for 2 months.  Denies ACS symptoms of chest pain, heart racing, shortness of breath, nausea, vomiting, belly pain, diaphoresis.  No current facility-administered medications for this encounter.   Current Outpatient Medications:  .  levothyroxine (SYNTHROID) 150 MCG tablet, Take one and a half tablets by mouth six days per week., Disp: 36 tablet, Rfl: 3   No Known Allergies  Past Medical History:  Diagnosis Date  . Anemia    Intercurrent iron deficiency anemia  . Hyperlipidemia   . Thyroid disease      Past Surgical History:  Procedure Laterality Date  . TUBAL LIGATION      Family History  Problem Relation Age of Onset  . Hypothyroidism Mother   . Diabetes Maternal Grandmother   . Cancer Maternal Grandfather        throat cancer  . Hypothyroidism Sister   . Hypothyroidism Son        Hyperthyroidism    Social History   Tobacco Use  . Smoking status: Never Smoker  . Smokeless tobacco: Never Used  Substance Use Topics  . Alcohol use:  Yes    Alcohol/week: 0.0 standard drinks    Comment: occasion  . Drug use: No    Review of Systems  Constitutional: Negative for fever and malaise/fatigue.  HENT: Negative for congestion, ear pain, sinus pain and sore throat.   Eyes: Negative for discharge and redness.  Respiratory: Negative for cough, hemoptysis, shortness of breath and wheezing.   Cardiovascular: Negative for chest pain.  Gastrointestinal: Negative for abdominal pain, diarrhea, nausea and vomiting.  Genitourinary: Negative for dysuria, flank pain and hematuria.  Musculoskeletal: Positive for myalgias.  Skin: Negative for rash.  Neurological: Positive for tingling. Negative for dizziness, speech change, focal weakness, seizures, weakness and headaches.  Psychiatric/Behavioral: Negative for depression and substance abuse.     Objective:   Vitals: BP (!) 149/83 (BP Location: Right Arm)   Pulse 69   Temp 98.9 F (37.2 C) (Oral)   Resp 18   SpO2 100%   Physical Exam Constitutional:      General: She is not in acute distress.    Appearance: Normal appearance. She is well-developed. She is not ill-appearing, toxic-appearing or diaphoretic.  HENT:     Head: Normocephalic and atraumatic.     Nose: Nose normal.     Mouth/Throat:     Mouth: Mucous membranes are moist.  Eyes:     Extraocular Movements: Extraocular movements intact.  Pupils: Pupils are equal, round, and reactive to light.  Cardiovascular:     Rate and Rhythm: Normal rate and regular rhythm.     Pulses: Normal pulses.     Heart sounds: Normal heart sounds. No murmur. No friction rub. No gallop.   Pulmonary:     Effort: Pulmonary effort is normal. No respiratory distress.     Breath sounds: Normal breath sounds. No stridor. No wheezing, rhonchi or rales.  Skin:    General: Skin is warm and dry.     Findings: No rash.  Neurological:     Mental Status: She is alert and oriented to person, place, and time.  Psychiatric:        Mood and  Affect: Mood normal.        Behavior: Behavior normal.        Thought Content: Thought content normal.     Assessment and Plan :   1. Cervical radiculopathy   2. Left arm pain   3. Hypothyroidism, unspecified type    Patient will likely need an MRI of cervical region.  For now counseled on cervical radiculopathy only use a steroid course.  Patient is having difficulty sleeping from the arm pain which is primarily positional, will have her use Flexeril for this.  Recommended she hydrate very well.  Patient needs follow-up regarding her elevated blood pressure and chronic conditions.  Also recommended that she pursue MRI or referral to a neurologist/neurosurgeon given her cervical radiculopathy. Counseled patient on potential for adverse effects with medications prescribed/recommended today, ER and return-to-clinic precautions discussed, patient verbalized understanding.    Jaynee Eagles, PA-C 08/30/19 1233

## 2019-08-30 NOTE — ED Triage Notes (Signed)
Pt reports a pain in the back of her left arm that she feels mostly at night when lying in bed.  She states she has been feeling it some today.  It starts in the middle of her upper arm and radiates down past her elbow to about midway to her forearm.  She does also report some tingling in the left hand at times.  Pt denies any N, V, h/a, dizziness, SOB or radiation to her shoulder, chest or jaw with the arm pain.

## 2019-10-17 ENCOUNTER — Encounter: Payer: Self-pay | Admitting: Family Medicine

## 2019-10-17 ENCOUNTER — Other Ambulatory Visit: Payer: Self-pay

## 2019-10-17 ENCOUNTER — Ambulatory Visit: Payer: BC Managed Care – PPO | Admitting: Family Medicine

## 2019-10-17 ENCOUNTER — Ambulatory Visit (INDEPENDENT_AMBULATORY_CARE_PROVIDER_SITE_OTHER)
Admission: RE | Admit: 2019-10-17 | Discharge: 2019-10-17 | Disposition: A | Discharge status: Home / Self Care | Payer: BC Managed Care – PPO | Source: Ambulatory Visit | Attending: Family Medicine | Admitting: Family Medicine

## 2019-10-17 ENCOUNTER — Telehealth: Payer: Self-pay | Admitting: Family Medicine

## 2019-10-17 DIAGNOSIS — M5412 Radiculopathy, cervical region: Secondary | ICD-10-CM | POA: Insufficient documentation

## 2019-10-17 NOTE — Assessment & Plan Note (Signed)
Pt has L arm symptoms with some tingling Briefly improved in the past with prednisone Pain is reproduced with neck flexion today (mild) CS film ordered Ins to try ice on neck to see if this helps also  Further plan to follow

## 2019-10-17 NOTE — Telephone Encounter (Signed)
Ref done  Will route to [facility] 

## 2019-10-17 NOTE — Patient Instructions (Signed)
Xray of neck today  We will call with result and plan  If you want to try some cold/ice on neck when bothersome you can  Also advil is ok with food if it helps   Try change your work position

## 2019-10-17 NOTE — Progress Notes (Signed)
Subjective:    Patient ID: Carrie May, female    DOB: 08/24/1966, 53 y.o.   MRN: [id number]  This visit occurred during the SARS-CoV-2 public health emergency.  Safety protocols were in place, including screening questions prior to the visit, additional usage of staff PPE, and extensive cleaning of exam room while observing appropriate contact time as indicated for disinfecting solutions.    HPI Pt presents for L shoulder/arm pain   She has h/o cervical radiculopathy  Was seen in ED in nov for this -given prednisone and flexeril  The medications helped temporarily   At first she felt pain over L lateral upper arm in sept  Then it got worse  Then tingling down her arm and hand (whole hand )  No lost strength   Symptoms worsen when she leans on her L arm (? A little sore over elbow)  Feels a lump over the back of her elbow  Feels better to to put her arm up in the air   Used advil once or twice   Has not had any xrays   No neck pain  Can bring on arm tingling with neck flexion   Patient Active Problem List   Diagnosis Date Noted  . Cervical radiculopathy 10/17/2019  . Colon cancer screening 02/22/2017  . Encounter for annual routine gynecological examination 02/22/2017  . Menorrhagia 02/19/2016  . Encounter for screening mammogram for breast cancer 02/18/2016  . Routine general medical examination at a health care facility 01/11/2016  . 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 Anemia, iron deficiency 08/25/2015  . Goiter 05/30/2007  . Hypothyroidism 05/30/2007  . 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 Hyperlipidemia 05/30/2007   Past Medical History:  Diagnosis Date  . Anemia    Intercurrent iron deficiency anemia  . Hyperlipidemia   . Thyroid disease    Past Surgical History:  Procedure Laterality Date  . TUBAL LIGATION     Social History   Tobacco Use  . Smoking status: Never Smoker  . Smokeless tobacco: Never Used  Substance Use Topics  .  Alcohol use: Yes    Alcohol/week: 0.0 standard drinks    Comment: occasion  . Drug use: No   Family History  Problem Relation Age of Onset  . Hypothyroidism Mother   . Diabetes Maternal Grandmother   . Cancer Maternal Grandfather        throat cancer  . Hypothyroidism Sister   . Hypothyroidism Son        Hyperthyroidism   No Known Allergies Current Outpatient Medications on File Prior to Visit  Medication Sig Dispense Refill  . levothyroxine (SYNTHROID) 150 MCG tablet Take one and a half tablets by mouth six days per week. 36 tablet 3   No current facility-administered medications on file prior to visit.    Review of Systems  Constitutional: Negative for activity change, appetite change, fatigue, fever and unexpected weight change.  HENT: Negative for congestion, ear pain, rhinorrhea, sinus pressure and sore throat.   Eyes: Negative for pain, redness and visual disturbance.  Respiratory: Negative for cough, shortness of breath and wheezing.   Cardiovascular: Negative for chest pain and palpitations.  Gastrointestinal: Negative for abdominal pain, blood in stool, constipation and diarrhea.  Endocrine: Negative for polydipsia and polyuria.  Genitourinary: Negative for dysuria, frequency and urgency.  Musculoskeletal: Negative for arthralgias, back pain, joint swelling, myalgias, neck pain and neck stiffness.       L arm pain   Skin: Negative for pallor and rash.  Allergic/Immunologic: Negative for environmental allergies.  Neurological: Positive for numbness. Negative for dizziness, syncope and headaches.  Hematological: Negative for adenopathy. Does not bruise/bleed easily.  Psychiatric/Behavioral: Negative for decreased concentration and dysphoric mood. The patient is not nervous/anxious.        Objective:   Physical Exam Constitutional:      General: She is not in acute distress.    Appearance: Normal appearance. She is obese. She is not ill-appearing.  HENT:     Head:  Normocephalic and atraumatic.     Mouth/Throat:     Mouth: Mucous membranes are moist.     Pharynx: Oropharynx is clear.  Eyes:     General: No scleral icterus.    Extraocular Movements: Extraocular movements intact.     Conjunctiva/sclera: Conjunctivae normal.     Pupils: Pupils are equal, round, and reactive to light.  Neck:     Vascular: No carotid bruit.     Comments: Pt notes some tingling in her deltoid area when neck is fully flexed  Nl rom  Cardiovascular:     Rate and Rhythm: Normal rate and regular rhythm.     Heart sounds: Normal heart sounds.  Pulmonary:     Effort: Pulmonary effort is normal. No respiratory distress.     Breath sounds: Normal breath sounds. No wheezing or rales.  Musculoskeletal:     Left shoulder: Normal. No swelling, deformity, tenderness, bony tenderness or crepitus. Normal range of motion. Normal strength. Normal pulse.     Left wrist: No tenderness or crepitus.     Cervical back: Normal range of motion and neck supple. No swelling, deformity, rigidity, spasms, tenderness or bony tenderness. Normal range of motion.     Comments: Some arm tingling is reproduced with full flexion of CS Nl rom of CS and UEs L shoulder-neg hawking/neer tests , no tenderness and nl rom  L elbow-nl rom and no tenderness L wrist nl rom and no tenderness (neg tinel and phalen tests)  Some relief of symptoms with L shoulder abduction   Lymphadenopathy:     Cervical: No cervical adenopathy.  Skin:    General: Skin is warm and dry.     Findings: No erythema or rash.  Neurological:     Mental Status: She is alert.     Cranial Nerves: No cranial nerve deficit.     Sensory: No sensory deficit.     Motor: No weakness.     Coordination: Coordination normal.     Gait: Gait normal.     Deep Tendon Reflexes: Reflexes normal.  Psychiatric:        Mood and Affect: Mood normal.        Cognition and Memory: Cognition and memory normal.           Assessment & Plan:    Problem List Items Addressed This Visit      Nervous and Auditory   Cervical radiculopathy    Pt has L arm symptoms with some tingling Briefly improved in the past with prednisone Pain is reproduced with neck flexion today (mild) CS film ordered Ins to try ice on neck to see if this helps also  Further plan to follow      Relevant Orders   DG Cervical Spine Complete (Completed)

## 2019-10-17 NOTE — Telephone Encounter (Signed)
-----   Message from Tammi Sou, [place] sent at 10/17/2019  3:55 PM EST ----- Pt notified of xray results and Dr. Marliss Coots comments. Pt agrees with Ortho referral, I advise pt our [facility] will call to schedule appt with her. Please put referral in

## 2019-10-18 NOTE — Telephone Encounter (Signed)
Appt made and patient aware.  

## 2020-01-16 ENCOUNTER — Other Ambulatory Visit: Payer: Self-pay

## 2020-01-16 ENCOUNTER — Other Ambulatory Visit (INDEPENDENT_AMBULATORY_CARE_PROVIDER_SITE_OTHER): Payer: BC Managed Care – PPO

## 2020-01-16 DIAGNOSIS — E063 Autoimmune thyroiditis: Secondary | ICD-10-CM

## 2020-01-16 LAB — T4, FREE: Free T4: 0.77 ng/dL (ref 0.60–1.60)

## 2020-01-16 LAB — TSH: TSH: 8.46 u[IU]/mL — ABNORMAL HIGH (ref 0.35–4.50)

## 2020-01-21 ENCOUNTER — Other Ambulatory Visit: Payer: Self-pay

## 2020-01-23 ENCOUNTER — Ambulatory Visit: Payer: BC Managed Care – PPO | Admitting: Endocrinology

## 2020-01-23 ENCOUNTER — Encounter: Payer: Self-pay | Admitting: Endocrinology

## 2020-01-23 ENCOUNTER — Other Ambulatory Visit: Payer: Self-pay

## 2020-01-23 VITALS — BP 110/74 | HR 67 | Ht 62.0 in | Wt 180.0 lb

## 2020-01-23 DIAGNOSIS — E063 Autoimmune thyroiditis: Secondary | ICD-10-CM | POA: Diagnosis not present

## 2020-01-23 NOTE — Progress Notes (Signed)
Patient ID: Carrie May, female   DOB: Mar 09, 1966, 54 y.o.   MRN: [id number]            Reason for Appointment:  Hypothyroidism, follow-up visit      History of Present Illness:   Hypothyroidism was first diagnosed in 2005 She was about 2 months postpartum at that time and she believes that her thyroid was checked on a routine basis However she had been starting to have more fatigue especially in her legs even during her pregnancy Also she was having some hair loss and tendency to weight gain  She was started on levothyroxine by an endocrinologist and followed for some time on various doses About 8-9 years ago when she moved here she was complaining about having continued fatigue and some hair loss with Synthroid and the local endocrinologist changed her to Arm[facility] thyroid Records are not available from a previous history and labs; TPO antibody was increased at 844 She was followed with annual ultrasound exams also but the last 3 reports from 2012 until 2015 do not indicate any significant abnormality except irregular texture, minimal enlargement of the right lobe and no nodules She had been on the same dose of 120 mg Armour Thyroid for about 2-3 years  Recent history:  She was changed from [facility] Thyroid 120 mg to levothyroxine and Cytomel separately because of high free T3 level of 6.9 in 8/17 Initially she was taking 5 g of Cytomel in the morning along with 112 g levothyroxine Because of her high free T3 level she was told to stop Cytomel and increase the levothyroxine up to 137 g  Now she is taking her generic levothyroxine 150 mcg 6-1/2 pills a week She thinks her pharmacy may have changed the name of the levothyroxine manufacturer She has not missed any doses  She feels fairly good without any unusual fatigue Has had chronic cold intolerance and hair loss, not any worse  She has a consistent routine of taking her tablet very regularly before breakfast in the  mornings Not on iron supplements  Her labs now show her TSH to be higher at 8.5 compared to 3.9  T4 level is not consistent with TSH  Patient's weight history is as follows:     Wt Readings from Last 3 Encounters:  01/23/20 180 lb (81.6 kg)  10/17/19 192 lb (87.1 kg)  07/25/19 182 lb 3.2 oz (82.6 kg)    Thyroid function results have been as follows:  Lab Results  Component Value Date   FREET4 0.77 01/16/2020   FREET4 0.69 07/09/2019   FREET4 0.95 05/01/2019   TSH 8.46 (H) 01/16/2020   TSH 3.85 07/09/2019   TSH 0.16 (L) 05/01/2019   Lab Results  Component Value Date   T3FREE 4.1 09/26/2018   T3FREE 4.9 (H) 06/05/2018   T3FREE 5.5 (H) 04/14/2018   T3FREE 5.0 (H) 10/24/2017     Past Medical History:  Diagnosis Date  . Anemia    Intercurrent iron deficiency anemia  . Hyperlipidemia   . Thyroid disease     Past Surgical History:  Procedure Laterality Date  . TUBAL LIGATION      Family History  Problem Relation Age of Onset  . Hypothyroidism Mother   . Diabetes Maternal Grandmother   . Cancer Maternal Grandfather        throat cancer  . Hypothyroidism Sister   . Hypothyroidism Son        Hyperthyroidism    Social History:  reports that she  has never smoked. She has never used smokeless tobacco. She reports current alcohol use. She reports that she does not use drugs.  Allergies: No Known Allergies  Allergies as of 01/23/2020   No Known Allergies     Medication List       Accurate as of January 23, 2020  3:11 PM. If you have any questions, ask your nurse or doctor.        levothyroxine 150 MCG tablet Commonly known as: SYNTHROID Take one and a half tablets by mouth six days per week.        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, long-standing and with significant family history of autoimmune thyroid disease   She is now on a relatively stable dose of 150 mcg levothyroxine 6-1/2 tablets a week Again she is appearing to have inconsistent TSH levels and without any change in her compliance her TSH is higher She is asymptomatic from this This may be partly related to her getting a different manufacturer for her levothyroxine from her [facility] Do not have any interactions with food or nutritional supplements to reduce her absorption currently   PLAN:   SYNTHROID brand name 150 mcg, she will take 1 tablet 7 days a week As before she will take it at least 30 minutes before eating in the morning Co-pay card given Discussed that this may be able to keep her thyroid levels more consistently in the normal range   Elayne Snare 01/23/2020, 3:11 PM

## 2020-01-24 MED ORDER — SYNTHROID 150 MCG PO TABS: 150.0000 ug | ORAL_TABLET | Freq: Every day | ORAL | 3 refills | Status: DC

## 2020-02-19 ENCOUNTER — Other Ambulatory Visit: Payer: Self-pay | Admitting: Endocrinology

## 2020-02-19 MED ORDER — SYNTHROID 150 MCG PO TABS: 150.0000 ug | ORAL_TABLET | Freq: Every day | ORAL | 2 refills | Status: DC

## 2020-03-20 ENCOUNTER — Other Ambulatory Visit: Payer: BC Managed Care – PPO

## 2020-05-12 ENCOUNTER — Other Ambulatory Visit: Payer: BC Managed Care – PPO

## 2020-06-03 ENCOUNTER — Other Ambulatory Visit: Payer: BC Managed Care – PPO

## 2020-07-23 ENCOUNTER — Other Ambulatory Visit: Payer: BC Managed Care – PPO

## 2020-07-25 ENCOUNTER — Ambulatory Visit: Payer: BC Managed Care – PPO | Admitting: Endocrinology

## 2021-01-29 ENCOUNTER — Other Ambulatory Visit: Payer: Self-pay | Admitting: Endocrinology

## 2021-02-08 ENCOUNTER — Other Ambulatory Visit: Payer: Self-pay | Admitting: Endocrinology

## 2021-02-18 ENCOUNTER — Other Ambulatory Visit: Payer: Self-pay | Admitting: Endocrinology

## 2021-03-02 ENCOUNTER — Other Ambulatory Visit: Payer: Self-pay | Admitting: Endocrinology

## 2021-03-02 DIAGNOSIS — E063 Autoimmune thyroiditis: Secondary | ICD-10-CM

## 2021-03-03 ENCOUNTER — Other Ambulatory Visit: Payer: Self-pay

## 2021-03-03 ENCOUNTER — Other Ambulatory Visit (INDEPENDENT_AMBULATORY_CARE_PROVIDER_SITE_OTHER): Payer: BC Managed Care – PPO

## 2021-03-03 DIAGNOSIS — E063 Autoimmune thyroiditis: Secondary | ICD-10-CM | POA: Diagnosis not present

## 2021-03-03 LAB — TSH: TSH: 4.96 u[IU]/mL — ABNORMAL HIGH (ref 0.35–4.50)

## 2021-03-04 LAB — T4, FREE: Free T4: 0.63 ng/dL (ref 0.60–1.60)

## 2021-03-11 ENCOUNTER — Ambulatory Visit: Payer: BC Managed Care – PPO | Admitting: Endocrinology

## 2021-03-11 ENCOUNTER — Encounter: Payer: Self-pay | Admitting: Endocrinology

## 2021-03-11 ENCOUNTER — Other Ambulatory Visit: Payer: Self-pay

## 2021-03-11 VITALS — BP 114/76 | HR 62 | Ht 62.0 in | Wt 182.4 lb

## 2021-03-11 DIAGNOSIS — E063 Autoimmune thyroiditis: Secondary | ICD-10-CM | POA: Diagnosis not present

## 2021-03-11 MED ORDER — SYNTHROID 150 MCG PO TABS: ORAL_TABLET | ORAL | 1 refills | Status: DC

## 2021-03-11 NOTE — Progress Notes (Signed)
Patient ID: Carrie May, female   DOB: 12-Nov-1965, 55 y.o.   MRN: [id number]            Reason for Appointment:  Hypothyroidism, follow-up visit    History of Present Illness:   Hypothyroidism was first diagnosed in 2005 She was about 2 months postpartum at that time and she believes that her thyroid was checked on a routine basis However she had been starting to have more fatigue especially in her legs even during her pregnancy Also she was having some hair loss and tendency to weight gain  She was started on levothyroxine by an endocrinologist and followed for some time on various doses About 8-9 years ago when she moved here she was complaining about having continued fatigue and some hair loss with Synthroid and the local endocrinologist changed her to [facility] thyroid Records are not available from a previous history and labs; TPO antibody was increased at 844 She was followed with annual ultrasound exams also but the last 3 reports from 2012 until 2015 do not indicate any significant abnormality except irregular texture, minimal enlargement of the right lobe and no nodules She had been on the same dose of 120 mg [facility] Thyroid for about 2-3 years  Recent history:  She was changed from [facility] Thyroid 120 mg to levothyroxine and Cytomel separately because of high free T3 level of 6.9 in 8/17 Initially she was taking 5 g of Cytomel in the morning along with 112 g levothyroxine Because of her high free T3 level she was told to stop Cytomel and increase the levothyroxine up to 137 g  Now she is taking SYNTHROID brand name once daily, 150 mcg since her last visit in 4/21  She has not missed any doses in the last month at least  She feels fairly good overall but does feel a little tired at times 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 No significant hair loss Weight is about the same  She remembers to be taking her tablet very regularly before breakfast in the  mornings Not on iron supplements  Her labs now show her TSH to be still high at about 5, previously 8.5 Free T4 low normal  Patient's weight history is as follows:   Wt Readings from Last 3 Encounters:  03/11/21 182 lb 6 oz (82.7 kg)  01/23/20 180 lb (81.6 kg)  10/17/19 192 lb (87.1 kg)    Thyroid function results have been as follows:  Lab Results  Component Value Date   TSH 4.96 (H) 03/03/2021   TSH 8.46 (H) 01/16/2020   TSH 3.85 07/09/2019   FREET4 0.63 03/03/2021   FREET4 0.77 01/16/2020   FREET4 0.69 07/09/2019    Lab Results  Component Value Date   T3FREE 4.1 09/26/2018   T3FREE 4.9 (H) 06/05/2018   T3FREE 5.5 (H) 04/14/2018   T3FREE 5.0 (H) 10/24/2017     Past Medical History:  Diagnosis Date  . Anemia    Intercurrent iron deficiency anemia  . Hyperlipidemia   . Thyroid disease     Past Surgical History:  Procedure Laterality Date  . TUBAL LIGATION      Family History  Problem Relation Age of Onset  . Hypothyroidism Mother   . Diabetes Maternal Grandmother   . Cancer Maternal Grandfather        throat cancer  . Hypothyroidism Sister   . Hypothyroidism Son        Hyperthyroidism    Social History:  reports that she has never smoked. She has never used smokeless tobacco. She reports current alcohol use. She reports that she does not use drugs.  Allergies: No Known Allergies  Allergies as of 03/11/2021   No Known Allergies     Medication List       Accurate as of Mar 11, 2021 10:53 AM. If you have any questions, ask your nurse or doctor.        Synthroid 150 MCG tablet Generic drug: levothyroxine 1 tablet daily.  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

## 2021-03-11 NOTE — Patient Instructions (Addendum)
Take extra 1/2 pill on Sundays 

## 2021-06-11 ENCOUNTER — Other Ambulatory Visit (INDEPENDENT_AMBULATORY_CARE_PROVIDER_SITE_OTHER): Payer: BC Managed Care – PPO

## 2021-06-11 ENCOUNTER — Other Ambulatory Visit: Payer: Self-pay

## 2021-06-11 DIAGNOSIS — E063 Autoimmune thyroiditis: Secondary | ICD-10-CM | POA: Diagnosis not present

## 2021-06-11 LAB — T4, FREE: Free T4: 0.71 ng/dL (ref 0.60–1.60)

## 2021-06-11 LAB — TSH: TSH: 0.65 u[IU]/mL (ref 0.35–5.50)

## 2021-06-18 ENCOUNTER — Ambulatory Visit: Payer: BC Managed Care – PPO | Admitting: Endocrinology

## 2021-06-18 ENCOUNTER — Other Ambulatory Visit: Payer: Self-pay

## 2021-06-18 VITALS — BP 126/82 | HR 76 | Ht 63.0 in | Wt 186.6 lb

## 2021-06-18 DIAGNOSIS — E063 Autoimmune thyroiditis: Secondary | ICD-10-CM

## 2021-06-18 NOTE — Progress Notes (Signed)
Patient ID: Ashlay Altieri, female   DOB: 12/19/1965, 55 y.o.   MRN: [id number]            Reason for Appointment:  Hypothyroidism, follow-up visit    History of Present Illness:   Hypothyroidism was first diagnosed in 2005 She was about 2 months postpartum at that time and she believes that her thyroid was checked on a routine basis However she had been starting to have more fatigue especially in her legs even during her pregnancy Also she was having some hair loss and tendency to weight gain  She was started on levothyroxine by an endocrinologist and followed for some time on various doses About 8-9 years ago when she moved here she was complaining about having continued fatigue and some hair loss with Synthroid and the local endocrinologist changed her to [facility] thyroid Records are not available from a previous history and labs; TPO antibody was increased at 844 She was followed with annual ultrasound exams also but the last 3 reports from 2012 until 2015 do not indicate any significant abnormality except irregular texture, minimal enlargement of the right lobe and no nodules She had been on the same dose of 120 mg [facility] Thyroid for about 2-3 years  Recent history:  She was changed from [facility] Thyroid 120 mg to levothyroxine and Cytomel separately because of high free T3 level of 6.9 in 8/17 Initially she was taking 5 g of Cytomel in the morning along with 112 g levothyroxine Because of her high free T3 level she was told to stop Cytomel and increase the levothyroxine up to 137 g  Now she is taking SYNTHROID 150 mcg brand name once daily with extra half tablet on Sundays  Dose was increased likely when her TSH was about 5  She feels less tired than before with this change  No other new complaints No history of goiter on previous exams  She has been taking her tablet very regularly before breakfast in the mornings Not on iron supplements  Her labs now show her TSH to be  back to normal below 1.09 Free T4 is improved  Patient's weight history is as follows:   Wt Readings from Last 3 Encounters:  06/18/21 186 lb 9.6 oz (84.6 kg)  03/11/21 182 lb 6 oz (82.7 kg)  01/23/20 180 lb (81.6 kg)    Thyroid function results have been as follows:  Lab Results  Component Value Date   TSH 0.65 06/11/2021   TSH 4.96 (H) 03/03/2021   TSH 8.46 (H) 01/16/2020   FREET4 0.71 06/11/2021   FREET4 0.63 03/03/2021   FREET4 0.77 01/16/2020    Lab Results  Component Value Date   T3FREE 4.1 09/26/2018   T3FREE 4.9 (H) 06/05/2018   T3FREE 5.5 (H) 04/14/2018   T3FREE 5.0 (H) 10/24/2017     Past Medical History:  Diagnosis Date   Anemia    Intercurrent iron deficiency anemia   Hyperlipidemia    Thyroid disease     Past Surgical History:  Procedure Laterality Date   TUBAL LIGATION      Family History  Problem Relation Age of Onset   Hypothyroidism Mother    Diabetes Maternal Grandmother    Cancer Maternal Grandfather        throat cancer   Hypothyroidism Sister    Hypothyroidism Son        Hyperthyroidism    Social History:  reports that she has never smoked. She has never used smokeless tobacco. She reports  current alcohol use. She reports that she does not use drugs.  Allergies: No Known Allergies  Allergies as of 06/18/2021   No Known Allergies      Medication List        Accurate as of June 18, 2021 10:18 AM. If you have any questions, ask your nurse or doctor.          levonorgestrel 20 MCG/DAY Iud Commonly known as: MIRENA 1 each by Intrauterine route once.   Synthroid 150 MCG tablet Generic drug: levothyroxine 1 tablet daily.  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

## 2021-08-25 IMAGING — DX DG CERVICAL SPINE COMPLETE 4+V
6 series · 6 of 6 positions shown · non-contrast
Comparison: None.

CLINICAL DATA: 53-year-old female with left arm numbness and
tingling.

EXAM:
CERVICAL SPINE - COMPLETE 4+ VIEW

[c-spine lat]
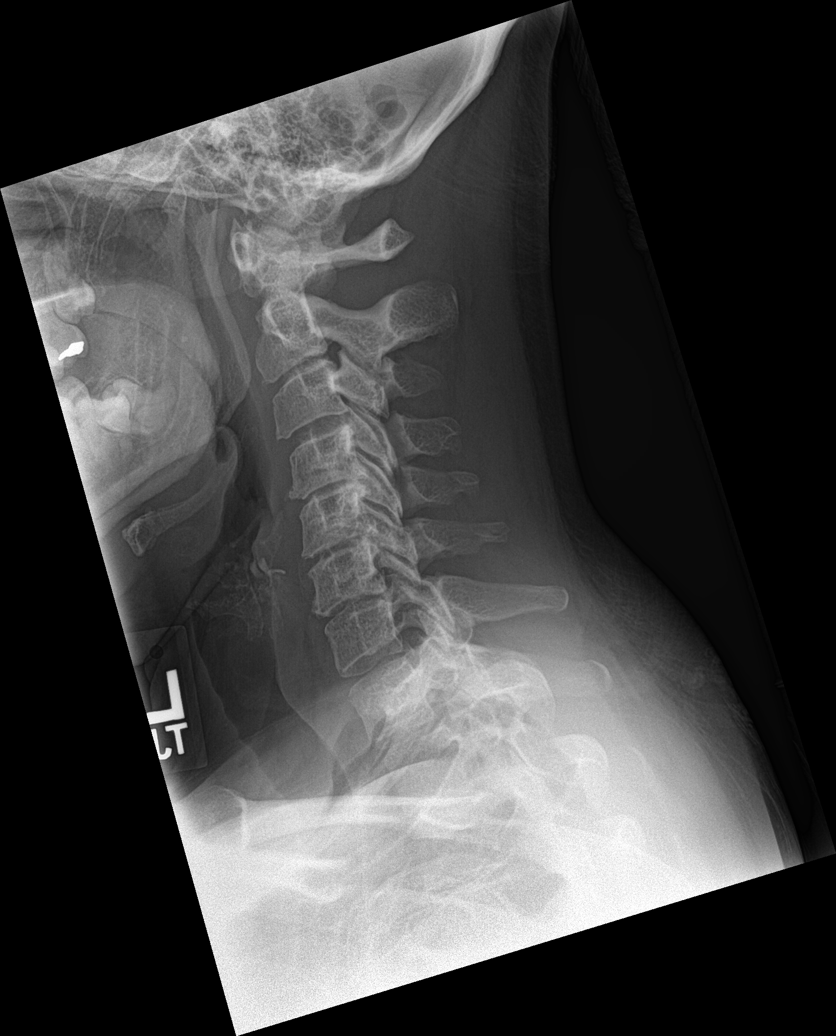

[c-spine obl (1 of 2)]
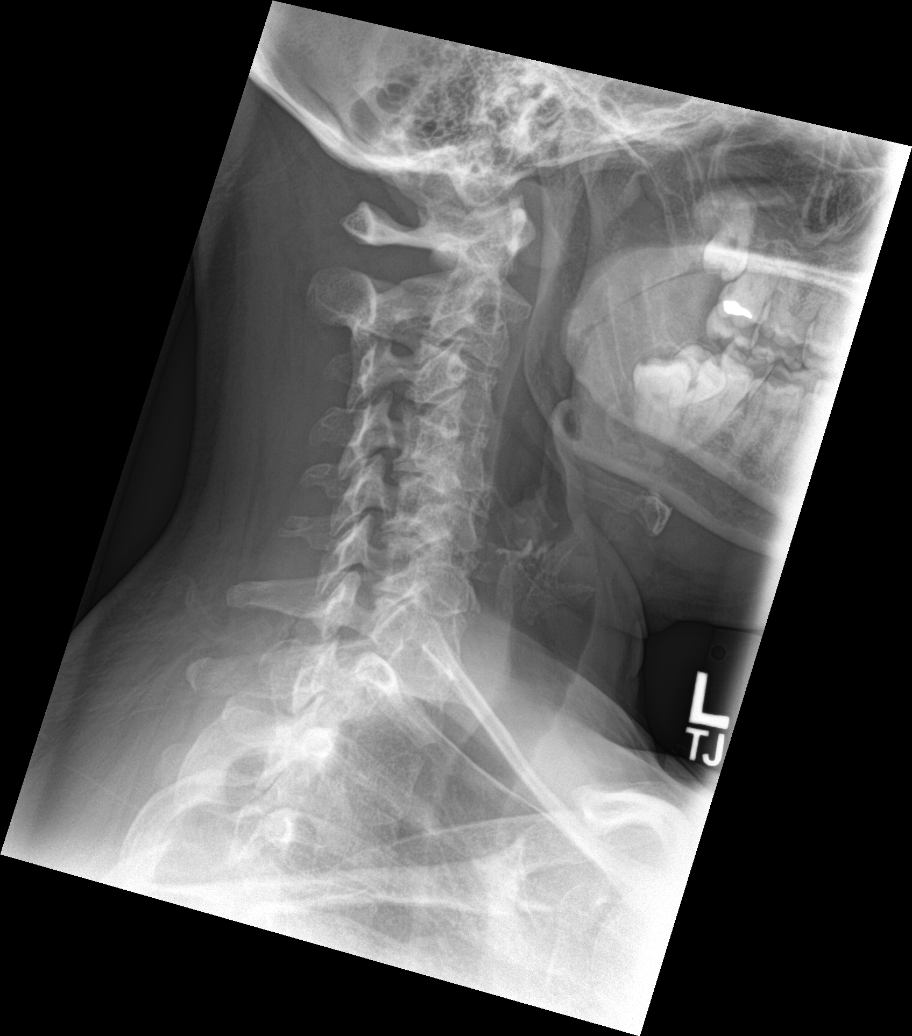

[c-spine obl (2 of 2)]
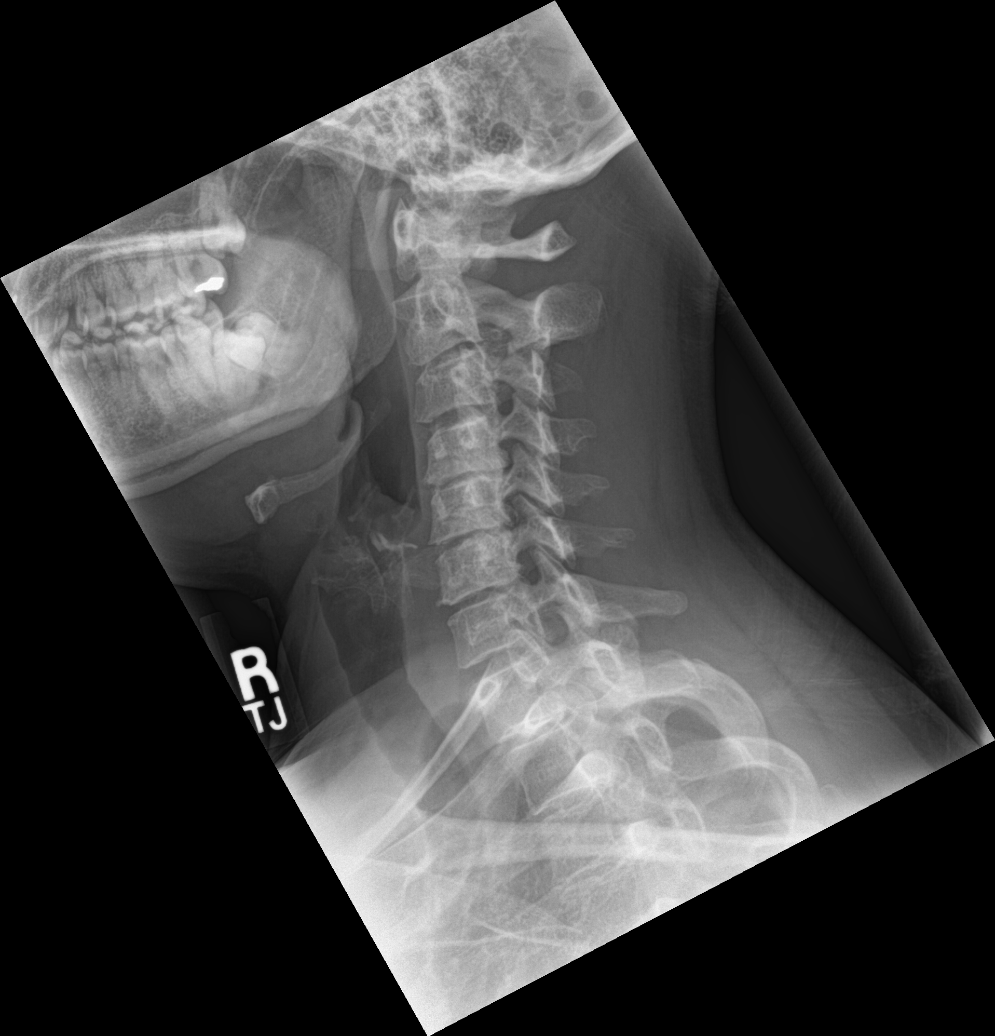

[c-spine ap]
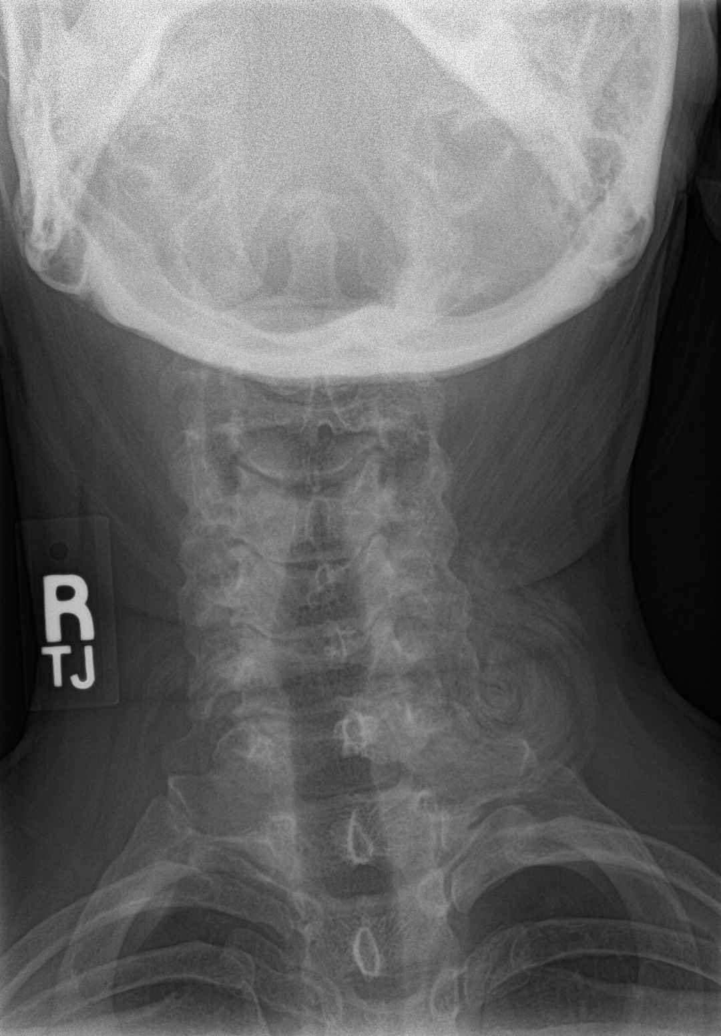

[c-spine open mouth]
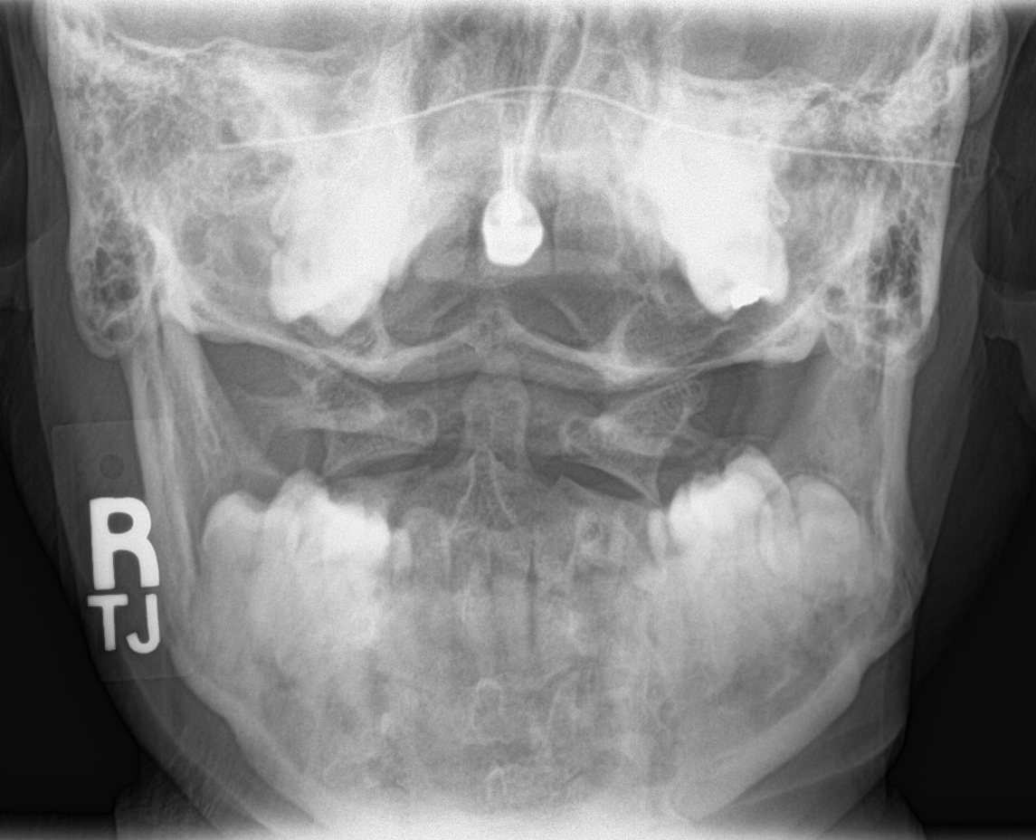

[c-spine swimmers]
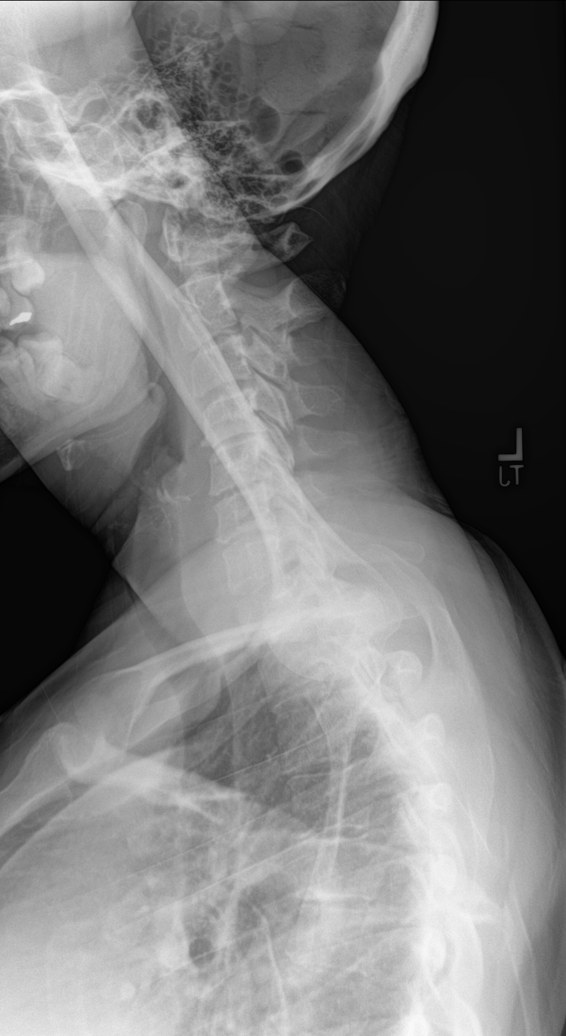

[6 of 6 positions shown; findings below may reference images not displayed]

FINDINGS: There is no acute fracture subluxation of the cervical spine. There
is reversal of normal cervical lordosis which may be positional or
due to muscle spasm but likely secondary to degenerative changes.

Multilevel degenerative changes with endplate irregularity and disc
space narrowing most prominent at C4-C6. There is associated
multilevel neural foramina narrowing involving C3-C7. Moderate to
severe left C4-C5 and C5-C6 neural foramina stenosis.

The visualized posterior elements and odontoid appear intact. There
is anatomic alignment of the lateral masses of C1 and C2. The soft
tissues are unremarkable.
IMPRESSION: 1. No acute fracture or subluxation of the cervical spine.
2. Multilevel degenerative changes and neural foramina narrowing.

## 2021-09-20 ENCOUNTER — Other Ambulatory Visit: Payer: Self-pay | Admitting: Endocrinology

## 2021-12-10 ENCOUNTER — Other Ambulatory Visit (INDEPENDENT_AMBULATORY_CARE_PROVIDER_SITE_OTHER): Payer: BC Managed Care – PPO

## 2021-12-10 ENCOUNTER — Other Ambulatory Visit: Payer: Self-pay

## 2021-12-10 DIAGNOSIS — E063 Autoimmune thyroiditis: Secondary | ICD-10-CM | POA: Diagnosis not present

## 2021-12-10 LAB — TSH: TSH: 0.44 u[IU]/mL (ref 0.35–5.50)

## 2021-12-10 LAB — T4, FREE: Free T4: 0.77 ng/dL (ref 0.60–1.60)

## 2021-12-17 ENCOUNTER — Ambulatory Visit: Payer: BC Managed Care – PPO | Admitting: Endocrinology

## 2022-01-11 ENCOUNTER — Encounter: Payer: Self-pay | Admitting: Endocrinology

## 2022-01-11 ENCOUNTER — Other Ambulatory Visit: Payer: Self-pay

## 2022-01-11 ENCOUNTER — Ambulatory Visit: Payer: BC Managed Care – PPO | Admitting: Endocrinology

## 2022-01-11 VITALS — BP 122/84 | HR 59 | Ht 62.0 in | Wt 191.0 lb

## 2022-01-11 DIAGNOSIS — E063 Autoimmune thyroiditis: Secondary | ICD-10-CM

## 2022-01-11 NOTE — Progress Notes (Signed)
Patient ID: Carrie May, female   DOB: June 10, 1966, 56 y.o.   MRN: [id number] ? ?       ? ? ? ?Reason for Appointment:  Hypothyroidism, follow-up visit ? ? ? History of Present Illness:  ? ?Hypothyroidism was first diagnosed in 2005 ?She was about 2 months postpartum at that time and she believes that her thyroid was checked on a routine basis ?However she had been starting to have more fatigue especially in her legs even during her pregnancy ?Also she was having some hair loss and tendency to weight gain ? ?She was started on levothyroxine by an endocrinologist and followed for some time on various doses ?About 8-9 years ago when she moved here she was complaining about having continued fatigue and some hair loss with Synthroid and the local endocrinologist changed her to [facility] thyroid ?Records are not available from a previous history and labs; TPO antibody was increased at 844 ?She was followed with annual ultrasound exams also but the last 3 reports from 2012 until 2015 do not indicate any significant abnormality except irregular texture, minimal enlargement of the right lobe and no nodules ?She had been on the same dose of 120 mg [facility] Thyroid for about 2-3 years ? ?Recent history: ? ?She was changed from [facility] Thyroid 120 mg to levothyroxine and Cytomel separately because of high free T3 level of 6.9 in 8/17 ?Initially she was taking 5 ?g of Cytomel in the morning along with 112 ?g levothyroxine ?Because of her high free T3 level she was told to stop Cytomel and increase the levothyroxine up to 137 ?g ? ?Currently she is taking SYNTHROID 150 mcg brand name once daily with extra half tablet on Sundays as a stable dose ? ?Her main symptom with hypothyroidism is fatigue ?However because of her work and sleep schedule she tends to get tired periodically ?Also gaining weight progressively ? ?No history of goiter on previous exams ? ?She has been taking her brand-name Synthroid very regularly before breakfast  in the mornings ?Not on iron supplements ? ?Her labs now show her TSH to be relatively lower at 0.44 ?Free T4 is normal ? ?Patient's weight history is as follows: ? ? ?Wt Readings from Last 3 Encounters:  ?01/11/22 191 lb (86.6 kg)  ?06/18/21 186 lb 9.6 oz (84.6 kg)  ?03/11/21 182 lb 6 oz (82.7 kg)  ? ? ?Thyroid function results have been as follows: ? ?Lab Results  ?Component Value Date  ? TSH 0.44 12/10/2021  ? TSH 0.65 06/11/2021  ? TSH 4.96 (H) 03/03/2021  ? FREET4 0.77 12/10/2021  ? FREET4 0.71 06/11/2021  ? FREET4 0.63 03/03/2021  ? ? ?Lab Results  ?Component Value Date  ? T3FREE 4.1 09/26/2018  ? T3FREE 4.9 (H) 06/05/2018  ? T3FREE 5.5 (H) 04/14/2018  ? T3FREE 5.0 (H) 10/24/2017  ? ? ? ?Past Medical History:  ?Diagnosis Date  ? Anemia   ? Intercurrent iron deficiency anemia  ? Hyperlipidemia   ? Thyroid disease   ? ? ?Past Surgical History:  ?Procedure Laterality Date  ? TUBAL LIGATION    ? ? ?Family History  ?Problem Relation Age of Onset  ? Hypothyroidism Mother   ? Diabetes Maternal Grandmother   ? Cancer Maternal Grandfather   ?     throat cancer  ? Hypothyroidism Sister   ? Hypothyroidism Son   ?     Hyperthyroidism  ? ? ?Social History:  reports that she has never smoked. She has never used  smokeless tobacco. She reports current alcohol use. She reports that she does not use drugs. ? ?Allergies: No Known Allergies ? ?Allergies as of 01/11/2022   ?No Known Allergies ?  ? ?  ?Medication List  ?  ? ?  ? Accurate as of January 11, 2022  4:05 PM. If you have any questions, ask your nurse or doctor.  ?  ?  ? ?  ? ?levonorgestrel 20 MCG/DAY Iud ?Commonly known as: MIRENA ?1 each by Intrauterine route once. ?  ?Synthroid 150 MCG tablet ?Generic drug: levothyroxine ?1 TABLET DAILY. PLEASE MAKE FOLLOW-UP APPOINTMENT FOR FURTHER REFILLS ?  ? ?  ? ? ? ?ROS ? ?She is taking iron supplements ?      ? Examination: ?  ? BP 122/84   Pulse (!) 59   Ht 5\' 2"  (1.575 m)   Wt 191 lb (86.6 kg)   SpO2 98%   BMI 34.93 kg/m?   ? ?Thyroid not palpable ?Biceps reflexes appear normal ? ? ?Assessment: ? 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 ?She continues to take brand-name Synthroid, 7-1/2 tablets a week ?She has not been quite regular with taking her thyroid supplement ?No unusual or unexpected fatigue, also no anxiety or palpitations ? ?TSH is 0.44 ? ?PLAN:  ? ?SYNTHROID will be 150 mcg, 1 tablet daily and she will leave off to half tablet on Sundays ? ?Reminded her not to take any iron tablets if needed at the same time ? ?Follow-up in 6 months ? ? ?01-30-1974 ?01/11/2022, 4:05 PM  ? ? ? ?

## 2022-03-29 ENCOUNTER — Other Ambulatory Visit: Payer: Self-pay | Admitting: Endocrinology

## 2022-07-12 ENCOUNTER — Other Ambulatory Visit (INDEPENDENT_AMBULATORY_CARE_PROVIDER_SITE_OTHER): Payer: BC Managed Care – PPO

## 2022-07-12 DIAGNOSIS — E063 Autoimmune thyroiditis: Secondary | ICD-10-CM | POA: Diagnosis not present

## 2022-07-12 LAB — TSH: TSH: 0.21 u[IU]/mL — ABNORMAL LOW (ref 0.35–5.50)

## 2022-07-12 LAB — T4, FREE: Free T4: 1 ng/dL (ref 0.60–1.60)

## 2022-07-15 ENCOUNTER — Ambulatory Visit: Payer: BC Managed Care – PPO | Admitting: Endocrinology

## 2022-07-15 ENCOUNTER — Encounter: Payer: Self-pay | Admitting: Endocrinology

## 2022-07-15 VITALS — BP 140/90 | HR 64 | Ht 62.0 in | Wt 185.0 lb

## 2022-07-15 DIAGNOSIS — E063 Autoimmune thyroiditis: Secondary | ICD-10-CM

## 2022-07-15 NOTE — Patient Instructions (Addendum)
Take 6 1/2 pills weekly  Next Rx 137ug

## 2022-07-15 NOTE — Progress Notes (Signed)
Patient ID: Carrie May, female   DOB: [birth year], 56 y.o.   MRN: [id number]            Reason for Appointment:  Hypothyroidism, follow-up visit    History of Present Illness:   Hypothyroidism was first diagnosed in 2005 She was about 2 months postpartum at that time and she believes that her thyroid was checked on a routine basis However she had been starting to have more fatigue especially in her legs even during her pregnancy Also she was having some hair loss and tendency to weight gain  She was started on levothyroxine by an endocrinologist and followed for some time on various doses About 8-9 years ago when she moved here she was complaining about having continued fatigue and some hair loss with Synthroid and the local endocrinologist changed her to [facility] thyroid Records are not available from a previous history and labs; TPO antibody was increased at 844 She was followed with annual ultrasound exams also but the last 3 reports from 2012 until 2015 do not indicate any significant abnormality except irregular texture, minimal enlargement of the right lobe and no nodules She had been on the same dose of 120 mg [facility] Thyroid for about 2-3 years  Recent history:  She was changed from [facility] Thyroid 120 mg to levothyroxine and Cytomel separately because of high free T3 level of 6.9 in 8/17 Initially she was taking 5 g of Cytomel in the morning along with 112 g levothyroxine Because of her high free T3 level she was told to stop Cytomel and increase the levothyroxine up to 137 g  Currently she is taking SYNTHROID 150 mcg brand name once daily This is affordable with insurance coverage  Her main symptom with hypothyroidism previously had been fatigue She feels fairly good now Has been able to get her weight down since her last visit  No history of goiter on previous exams  She has been taking her brand-name Synthroid very regularly before breakfast in the mornings If she  does take iron supplements it will be at night  Her labs now show her TSH to be relatively lower at 0.21 Free T4 is normal  Patient's weight history is as follows:   Wt Readings from Last 3 Encounters:  07/15/22 185 lb (83.9 kg)  01/11/22 191 lb (86.6 kg)  06/18/21 186 lb 9.6 oz (84.6 kg)    Thyroid function results have been as follows:  Lab Results  Component Value Date   TSH 0.21 (L) 07/12/2022   TSH 0.44 12/10/2021   TSH 0.65 06/11/2021   FREET4 1.00 07/12/2022   FREET4 0.77 12/10/2021   FREET4 0.71 06/11/2021    Lab Results  Component Value Date   T3FREE 4.1 09/26/2018   T3FREE 4.9 (H) 06/05/2018   T3FREE 5.5 (H) 04/14/2018   T3FREE 5.0 (H) 10/24/2017     Past Medical History:  Diagnosis Date   Anemia    Intercurrent iron deficiency anemia   Hyperlipidemia    Thyroid disease     Past Surgical History:  Procedure Laterality Date   TUBAL LIGATION      Family History  Problem Relation Age of Onset   Hypothyroidism Mother    Diabetes Maternal Grandmother    Cancer Maternal Grandfather        throat cancer   Hypothyroidism Sister    Hypothyroidism Son        Hyperthyroidism    Social History:  reports that she has never smoked. She has  never used smokeless tobacco. She reports current alcohol use. She reports that she does not use drugs.  Allergies: No Known Allergies  Allergies as of 07/15/2022   No Known Allergies      Medication List        Accurate as of July 15, 2022 10:30 AM. If you have any questions, ask your nurse or doctor.          levonorgestrel 20 MCG/DAY Iud Commonly known as: MIRENA 1 each by Intrauterine route once.   Synthroid 150 MCG tablet Generic drug: levothyroxine TAKE 1 TABLET BY MOUTH DAILY. 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

## 2022-10-07 ENCOUNTER — Other Ambulatory Visit: Payer: Self-pay | Admitting: Endocrinology

## 2022-10-26 LAB — HM COLONOSCOPY

## 2022-12-02 ENCOUNTER — Encounter: Payer: Self-pay | Admitting: Family Medicine

## 2023-01-10 ENCOUNTER — Other Ambulatory Visit (INDEPENDENT_AMBULATORY_CARE_PROVIDER_SITE_OTHER): Payer: BC Managed Care – PPO

## 2023-01-10 DIAGNOSIS — E063 Autoimmune thyroiditis: Secondary | ICD-10-CM | POA: Diagnosis not present

## 2023-01-11 LAB — TSH: TSH: 0.03 u[IU]/mL — ABNORMAL LOW (ref 0.35–5.50)

## 2023-01-11 LAB — T4, FREE: Free T4: 0.98 ng/dL (ref 0.60–1.60)

## 2023-01-13 ENCOUNTER — Encounter: Payer: Self-pay | Admitting: Endocrinology

## 2023-01-13 ENCOUNTER — Ambulatory Visit (INDEPENDENT_AMBULATORY_CARE_PROVIDER_SITE_OTHER): Payer: BC Managed Care – PPO | Admitting: Endocrinology

## 2023-01-13 VITALS — BP 120/78 | HR 65 | Ht 62.0 in | Wt 194.2 lb

## 2023-01-13 DIAGNOSIS — E063 Autoimmune thyroiditis: Secondary | ICD-10-CM | POA: Diagnosis not present

## 2023-01-13 MED ORDER — SYNTHROID 125 MCG PO TABS: 125.0000 ug | ORAL_TABLET | Freq: Every day | ORAL | 1 refills | Status: DC

## 2023-01-13 NOTE — Progress Notes (Signed)
.Patient ID: Carrie May, female   DOB: October 20, 1965, 57 y.o.   MRN: [id number]            Reason for Appointment:  Hypothyroidism, follow-up visit    History of Present Illness:   Hypothyroidism was first diagnosed in 2005 She was about 2 months postpartum at that time and she believes that her thyroid was checked on a routine basis However she had been starting to have more fatigue especially in her legs even during her pregnancy Also she was having some hair loss and tendency to weight gain  She was started on levothyroxine by an endocrinologist and followed for some time on various doses About 8-9 years ago when she moved here she was complaining about having continued fatigue and some hair loss with Synthroid and the local endocrinologist changed her to [facility] thyroid Records are not available from a previous history and labs; TPO antibody was increased at 844 She was followed with annual ultrasound exams also but the last 3 reports from 2012 until 2015 do not indicate any significant abnormality except irregular texture, minimal enlargement of the right lobe and no nodules She had been on the same dose of 120 mg [facility] Thyroid for about 2-3 years  Recent history:  She was changed from [facility] Thyroid 120 mg to levothyroxine and Cytomel separately because of high free T3 level of 6.9 in 8/17 Initially she was taking 5 g of Cytomel in the morning along with 112 g levothyroxine Because of her high free T3 level she was told to stop Cytomel and increase the levothyroxine up to 137 g  As before she is taking SYNTHROID 150 mcg brand name once daily with half tablet on Sundays  Her main symptom with hypothyroidism previously had been fatigue Not complain of feeling unusually tired No cold intolerance Also no anxiety, shakiness or palpitations Her weight has fluctuated and has gone up this winter  No history of goiter on previous exams  She has been taking her Synthroid very  regularly before breakfast If she does take iron supplements it will be at night and has not taken any for a month  Her labs now show her TSH to be further decreased at 0.03 without change in free T4  Patient's weight history is as follows:   Wt Readings from Last 3 Encounters:  01/13/23 194 lb 3.2 oz (88.1 kg)  07/15/22 185 lb (83.9 kg)  01/11/22 191 lb (86.6 kg)    Thyroid function results have been as follows:  Lab Results  Component Value Date   TSH 0.03 (L) 01/10/2023   TSH 0.21 (L) 07/12/2022   TSH 0.44 12/10/2021   FREET4 0.98 01/10/2023   FREET4 1.00 07/12/2022   FREET4 0.77 12/10/2021    Lab Results  Component Value Date   T3FREE 4.1 09/26/2018   T3FREE 4.9 (H) 06/05/2018   T3FREE 5.5 (H) 04/14/2018   T3FREE 5.0 (H) 10/24/2017     Past Medical History:  Diagnosis Date   Anemia    Intercurrent iron deficiency anemia   Hyperlipidemia    Thyroid disease     Past Surgical History:  Procedure Laterality Date   TUBAL LIGATION      Family History  Problem Relation Age of Onset   Hypothyroidism Mother    Diabetes Maternal Grandmother    Cancer Maternal Grandfather        throat cancer   Hypothyroidism Sister    Hypothyroidism Son        Hyperthyroidism  Social History:  reports that she has never smoked. She has never used smokeless tobacco. She reports current alcohol use. She reports that she does not use drugs.  Allergies: No Known Allergies  Allergies as of 01/13/2023   No Known Allergies      Medication List        Accurate as of January 13, 2023  9:03 AM. If you have any questions, ask your nurse or doctor.          levonorgestrel 20 MCG/DAY Iud Commonly known as: MIRENA 1 each by Intrauterine route once.   Synthroid 150 MCG tablet Generic drug: levothyroxine TAKE 1 TABLET BY MOUTH DAILY. 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, long-standing and with significant family history of autoimmune thyroid disease   She continues to take brand-name Synthroid, 150 mcg 6-1/2 tablets a week since her last visit in 9/23   She has not changed the way she takes her Synthroid and is still on brand-name She feels fairly good without any new symptoms and no symptoms suggestive of hyperthyroidism  TSH is further decreased at 0.03 despite reducing her dose slightly on the last visit   PLAN:   SYNTHROID will be reduced to 125 mcg She will follow-up with labs in about 6 weeks  Regular follow-up in 4 months  Jasdeep Kepner 01/13/2023, 9:03 AM

## 2023-03-01 ENCOUNTER — Other Ambulatory Visit: Payer: BC Managed Care – PPO

## 2023-03-12 ENCOUNTER — Other Ambulatory Visit: Payer: Self-pay | Admitting: Endocrinology

## 2023-04-07 ENCOUNTER — Other Ambulatory Visit: Payer: Self-pay | Admitting: Endocrinology

## 2023-05-06 ENCOUNTER — Other Ambulatory Visit: Payer: BC Managed Care – PPO

## 2023-05-10 ENCOUNTER — Other Ambulatory Visit: Payer: BC Managed Care – PPO

## 2023-05-10 DIAGNOSIS — E063 Autoimmune thyroiditis: Secondary | ICD-10-CM

## 2023-05-10 LAB — T4, FREE: Free T4: 0.81 ng/dL (ref 0.60–1.60)

## 2023-05-10 LAB — TSH: TSH: 0.75 u[IU]/mL (ref 0.35–5.50)

## 2023-05-13 ENCOUNTER — Ambulatory Visit: Payer: BC Managed Care – PPO | Admitting: Endocrinology

## 2024-04-04 ENCOUNTER — Encounter: Payer: Self-pay | Admitting: "Endocrinology

## 2024-04-04 ENCOUNTER — Telehealth: Payer: Self-pay | Admitting: "Endocrinology

## 2024-04-04 ENCOUNTER — Ambulatory Visit (INDEPENDENT_AMBULATORY_CARE_PROVIDER_SITE_OTHER): Admitting: "Endocrinology

## 2024-04-04 VITALS — BP 160/80 | HR 74 | Ht 62.0 in | Wt 202.0 lb

## 2024-04-04 DIAGNOSIS — E063 Autoimmune thyroiditis: Secondary | ICD-10-CM | POA: Diagnosis not present

## 2024-04-04 NOTE — Progress Notes (Signed)
 .Patient ID: Carrie May, female   DOB: [birth year], 58 y.o.   MRN: [id number]     Reason for Appointment:  Hypothyroidism, follow-up visit  History of Present Illness:   Past history per records: Hypothyroidism was first diagnosed in 2005 She was about 2 months postpartum at that time and she believes that her thyroid  was checked on a routine basis However she had been starting to have more fatigue especially in her legs even during her pregnancy Also she was having some hair loss and tendency to weight gain  She was started on levothyroxine  by an endocrinologist and followed for some time on various doses About 8-9 years ago when she moved here she was complaining about having continued fatigue and some hair loss with Synthroid  and the local endocrinologist changed her to [facility] thyroid  Records are not available from a previous history and labs; TPO antibody was increased at 844 She was followed with annual ultrasound exams also but the reports from 2012 until 2015 do not indicate any significant abnormality except irregular texture, minimal enlargement of the right lobe and no nodules She had been on the 120 mg [facility] Thyroid  for about 2-3 years in past  Recent history:  She was changed from [facility] Thyroid  120 mg to levothyroxine  and Cytomel  separately because of high free T3 level of 6.9 in 8/17 Initially she was taking 5 g of Cytomel  in the morning along with 112 g levothyroxine  Because of her high free T3 level she was told to stop Cytomel  and increase the levothyroxine  up to 137 g  Her main symptom with hypothyroidism previously had been fatigue Reports good energy Feels cold a little Denies constipation/hyperdefecation no anxiety, shakiness or palpitations Her weight has fluctuated and has gone up this winter No dysphagia/dysphonia/dyspnea  She has been taking her Synthroid  very regularly before breakfast  Patient's weight history is as follows:   Wt Readings from  Last 3 Encounters:  04/04/24 142 lb (64.4 kg)  01/13/23 194 lb 3.2 oz (88.1 kg)  07/15/22 185 lb (83.9 kg)    Thyroid  function results have been as follows:  Lab Results  Component Value Date   TSH 0.75 05/10/2023   TSH 0.03 (L) 01/10/2023   TSH 0.21 (L) 07/12/2022   FREET4 0.81 05/10/2023   FREET4 0.98 01/10/2023   FREET4 1.00 07/12/2022    Lab Results  Component Value Date   T3FREE 4.1 09/26/2018   T3FREE 4.9 (H) 06/05/2018   T3FREE 5.5 (H) 04/14/2018   T3FREE 5.0 (H) 10/24/2017     Past Medical History:  Diagnosis Date   Anemia    Intercurrent iron  deficiency anemia   Hyperlipidemia    Thyroid  disease     Past Surgical History:  Procedure Laterality Date   TUBAL LIGATION      Family History  Problem Relation Age of Onset   Hypothyroidism Mother    Diabetes Maternal Grandmother    Cancer Maternal Grandfather        throat cancer   Hypothyroidism Sister    Hypothyroidism Son        Hyperthyroidism    Social History:  reports that she has never smoked. She has never used smokeless tobacco. She reports current alcohol use. She reports that she does not use drugs.  Allergies: No Known Allergies  Allergies as of 04/04/2024   No Known Allergies      Medication List        Accurate as of April 04, 2024 10:03 AM. If you  have any questions, ask your nurse or doctor.          levonorgestrel 20 MCG/DAY Iud Commonly known as: MIRENA 1 each by Intrauterine route once.   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, long-standing and with significant family  history of autoimmune thyroid  disease   She continues to take brand-name Synthroid , 150 mcg 6-1/2 tablets a week since her last visit in 9/23   She has not changed the way she takes her Synthroid  and is still on brand-name She feels fairly good without any new symptoms and no symptoms suggestive of hyperthyroidism  TSH is further decreased at 0.03 despite reducing her dose slightly on the last visit   PLAN:  On SYNTHROID  125 mcg every day Recommend to take levothyroxine  first thing in the morning on empty stomach and wait at least 30 minutes to 1 hour before eating or drinking anything or taking any other medications. Space out levothyroxine  by 4 hours from any acid reflux medication, fibrate, iron , calcium, multivitamin, birth control pills and nutritional supplements.   She will follow-up with labs in about 6 weeks  Regular follow-up in 4 months  Raad Clayson 04/04/2024, 10:03 AM

## 2024-04-04 NOTE — Telephone Encounter (Signed)
 Patient called stating that Dr.Shamleffer needed to know the dosage of her Synthyroid medication,she takes 125 MCG.Contact her if anything further is needed at [phone]

## 2024-04-05 ENCOUNTER — Other Ambulatory Visit: Payer: Self-pay | Admitting: "Endocrinology

## 2024-04-05 LAB — TSH+FREE T4: TSH W/REFLEX TO FT4: 33.55 m[IU]/L — ABNORMAL HIGH (ref 0.40–4.50)

## 2024-04-05 LAB — T4, FREE: Free T4: 0.6 ng/dL — ABNORMAL LOW (ref 0.8–1.8)

## 2024-04-05 MED ORDER — LEVOTHYROXINE SODIUM 150 MCG PO TABS: 150.0000 ug | ORAL_TABLET | Freq: Every day | ORAL | 1 refills | Status: DC

## 2024-04-06 NOTE — Telephone Encounter (Signed)
 Calledpt was unable to lvm due to vm not setup.

## 2024-04-10 NOTE — Telephone Encounter (Signed)
 Calledpt was unable to lvm due to vm not setup.

## 2024-04-13 NOTE — Telephone Encounter (Signed)
 Called pt home phone number and lvm for pt to call office back regarding medication levothyroxine .

## 2024-04-13 NOTE — Telephone Encounter (Signed)
 Calledpt was unable to lvm due to vm not setup.

## 2024-04-16 NOTE — Telephone Encounter (Signed)
 Spoke to pt regarding increase a levothyroxine  due lab results still low.

## 2024-06-21 ENCOUNTER — Other Ambulatory Visit

## 2024-06-22 LAB — T4, FREE: Free T4: 1.2 ng/dL (ref 0.8–1.8)

## 2024-06-22 LAB — TSH+FREE T4: TSH W/REFLEX TO FT4: 0.17 m[IU]/L — ABNORMAL LOW (ref 0.40–4.50)

## 2024-06-27 ENCOUNTER — Ambulatory Visit: Admitting: "Endocrinology

## 2024-06-29 ENCOUNTER — Other Ambulatory Visit

## 2024-07-05 ENCOUNTER — Ambulatory Visit: Admitting: "Endocrinology

## 2024-08-17 ENCOUNTER — Encounter: Payer: Self-pay | Admitting: "Endocrinology

## 2024-08-17 ENCOUNTER — Other Ambulatory Visit

## 2024-08-17 ENCOUNTER — Ambulatory Visit: Admitting: "Endocrinology

## 2024-08-17 VITALS — BP 130/84 | HR 84 | Ht 62.0 in | Wt 200.0 lb

## 2024-08-17 DIAGNOSIS — E039 Hypothyroidism, unspecified: Secondary | ICD-10-CM

## 2024-08-17 DIAGNOSIS — E6609 Other obesity due to excess calories: Secondary | ICD-10-CM | POA: Diagnosis not present

## 2024-08-17 DIAGNOSIS — E66812 Obesity, class 2: Secondary | ICD-10-CM

## 2024-08-17 DIAGNOSIS — Z6836 Body mass index (BMI) 36.0-36.9, adult: Secondary | ICD-10-CM | POA: Diagnosis not present

## 2024-08-17 NOTE — Progress Notes (Signed)
 .Patient ID: Carrie May, female   DOB: 06-05-1966, 58 y.o.   MRN: [id number]     Reason for Appointment:  Hypothyroidism, follow-up visit  History of Present Illness:   Past history per records: Hypothyroidism was first diagnosed in 2005 She was about 2 months postpartum at that time and she believes that her thyroid  was checked on a routine basis However she had been starting to have more fatigue especially in her legs even during her pregnancy Also she was having some hair loss and tendency to weight gain  She was started on levothyroxine  by an endocrinologist and followed for some time on various doses About 8-9 years ago when she moved here she was complaining about having continued fatigue and some hair loss with Synthroid  and the local endocrinologist changed her to [facility] thyroid  Records are not available from a previous history and labs; TPO antibody was increased at 844 She was followed with annual ultrasound exams also but the reports from 2012 until 2015 do not indicate any significant abnormality except irregular texture, minimal enlargement of the right lobe and no nodules She had been on the 120 mg [facility] Thyroid  for about 2-3 years in past  Recent history:  She was changed from [facility] Thyroid  120 mg to levothyroxine  and Cytomel  separately because of high free T3 level of 6.9 in 8/17 Initially she was taking 5 g of Cytomel  in the morning along with 112 g levothyroxine  Because of her high free T3 level she was told to stop Cytomel  and increase the levothyroxine  up to 137 g  Her main symptom with hypothyroidism previously had been fatigue Reports good energy Feels cold at her baseline Denies constipation/hyperdefecation no anxiety, shakiness or palpitations Weight has gone up No dysphagia/dysphonia/dyspnea  She has been taking her Synthroid  very regularly before breakfast  Patient's weight history is as follows:  Wt Readings from Last 3 Encounters:  08/17/24  200 lb (90.7 kg)  04/04/24 202 lb (91.6 kg)  01/13/23 194 lb 3.2 oz (88.1 kg)    Thyroid  function results have been as follows:  Lab Results  Component Value Date   TSH 0.75 05/10/2023   TSH 0.03 (L) 01/10/2023   TSH 0.21 (L) 07/12/2022   FREET4 1.2 06/21/2024   FREET4 0.6 (L) 04/04/2024   FREET4 0.81 05/10/2023    Lab Results  Component Value Date   T3FREE 4.1 09/26/2018   T3FREE 4.9 (H) 06/05/2018   T3FREE 5.5 (H) 04/14/2018   T3FREE 5.0 (H) 10/24/2017     Past Medical History:  Diagnosis Date   Anemia    Intercurrent iron  deficiency anemia   Hyperlipidemia    Thyroid  disease     Past Surgical History:  Procedure Laterality Date   TUBAL LIGATION      Family History  Problem Relation Age of Onset   Hypothyroidism Mother    Diabetes Maternal Grandmother    Cancer Maternal Grandfather        throat cancer   Hypothyroidism Sister    Hypothyroidism Son        Hyperthyroidism    Social History:  reports that she has never smoked. She has never used smokeless tobacco. She reports current alcohol use. She reports that she does not use drugs.  Allergies: No Known Allergies  Allergies as of 08/17/2024   No Known Allergies      Medication List        Accurate as of August 17, 2024 10:26 AM. If you have any questions, ask your  nurse or doctor.          levonorgestrel 20 MCG/DAY Iud Commonly known as: MIRENA 1 each by Intrauterine route once.   levothyroxine  150 MCG tablet Commonly known as: Synthroid  Take 1 tablet (150 mcg total) by mouth daily before breakfast.         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 Space out levothyroxine  by 4 hours from any acid reflux medication, fibrate, iron , calcium, multivitamin, birth control pills and nutritional supplements.   She will follow-up with labs in about 6 weeks  Pt interested in weight loss Discussed lifestyle changes, medical management as well as bariatric surgery Maintain healthy lifestyle including 1200 Cal/day, 30 min of activity/day, avoiding refined/processed/outside food 20 minutes physical activity per day, in continuum or interruptedly through the day  Goals: less than 60 grams of carbohydrate/meal, 1200-1500 Cal/day, 10,0000 steps a day and weight loss of 0.5-1 lb/ wk  Sleep 7-9 hours/day, adapt good sleep hygiene Adapt de-stressing and relaxation techniques to prevent stress induced weight gain Avoid/switch medications that lead to weight gain by discussing with the prescribing physician   Return in about 3 months (around 11/17/2024) for visit + labs before next visit, labs today.   Tiann Saha 08/17/2024, 10:26 AM

## 2024-08-18 LAB — TSH: TSH: 1.24 m[IU]/L (ref 0.40–4.50)

## 2024-08-18 LAB — T4, FREE: Free T4: 0.8 ng/dL (ref 0.8–1.8)

## 2024-11-17 ENCOUNTER — Other Ambulatory Visit: Payer: Self-pay | Admitting: "Endocrinology

## 2024-11-20 ENCOUNTER — Ambulatory Visit: Admitting: "Endocrinology

## 2024-11-22 ENCOUNTER — Other Ambulatory Visit: Payer: Self-pay

## 2024-11-22 ENCOUNTER — Telehealth: Payer: Self-pay | Admitting: "Endocrinology

## 2024-11-22 DIAGNOSIS — E039 Hypothyroidism, unspecified: Secondary | ICD-10-CM

## 2024-11-22 MED ORDER — LEVOTHYROXINE SODIUM 150 MCG PO TABS: 150.0000 ug | ORAL_TABLET | Freq: Every day | ORAL | 0 refills | Status: AC

## 2024-11-22 NOTE — Telephone Encounter (Signed)
 MEDICATION: Levothyroxine   PHARMACY:  [facility] [address], [place] - [address], [place] [address] Phone: [phone]  Fax: [phone]   HAS THE PATIENT CONTACTED THEIR PHARMACY?  Yes  LAST REFILL:  @@LASTREFILL @  IS THIS A 90 DAY SUPPLY :   IS PATIENT OUT OF MEDICATION: N/A  IF NOT; HOW MUCH IS LEFT:   LAST APPOINTMENT DATE: @1 /31/2026  NEXT APPOINTMENT DATE:@3 /12/2024  DO WE HAVE YOUR PERMISSION TO LEAVE A DETAILED MESSAGE?: Yes  OTHER COMMENTS:    **Let patient know to contact pharmacy at the end of the day to make sure medication is ready. **  ** Please notify patient to allow 48-72 hours to process**  **Encourage patient to contact the pharmacy for refills or they can request refills through [facility]**

## 2024-12-18 ENCOUNTER — Ambulatory Visit: Admitting: "Endocrinology
